# Patient Record
Sex: Male | Born: 1975 | Race: White | Hispanic: No | Marital: Single | Smoking: Never smoker
Health system: Southern US, Community
[De-identification: ages and names within clinical notes are randomized; demographics above are authoritative.]

## PROBLEM LIST (undated history)

## (undated) DIAGNOSIS — S065XAA Traumatic subdural hemorrhage with loss of consciousness status unknown, initial encounter: Secondary | ICD-10-CM

## (undated) DIAGNOSIS — K219 Gastro-esophageal reflux disease without esophagitis: Secondary | ICD-10-CM

## (undated) DIAGNOSIS — S065X9A Traumatic subdural hemorrhage with loss of consciousness of unspecified duration, initial encounter: Secondary | ICD-10-CM

## (undated) DIAGNOSIS — M21372 Foot drop, left foot: Secondary | ICD-10-CM

## (undated) DIAGNOSIS — R413 Other amnesia: Secondary | ICD-10-CM

## (undated) DIAGNOSIS — S069X9A Unspecified intracranial injury with loss of consciousness of unspecified duration, initial encounter: Secondary | ICD-10-CM

## (undated) DIAGNOSIS — M1990 Unspecified osteoarthritis, unspecified site: Secondary | ICD-10-CM

## (undated) DIAGNOSIS — R091 Pleurisy: Secondary | ICD-10-CM

## (undated) HISTORY — PX: OTHER SURGICAL HISTORY: SHX169

## (undated) HISTORY — DX: Unspecified intracranial injury with loss of consciousness of unspecified duration, initial encounter: S06.9X9A

## (undated) HISTORY — PX: BRAIN SURGERY: SHX531

## (undated) HISTORY — PX: GASTROSTOMY W/ FEEDING TUBE: SUR642

---
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
## 2014-06-10 DIAGNOSIS — Z87820 Personal history of traumatic brain injury: Secondary | ICD-10-CM | POA: Insufficient documentation

## 2014-06-10 DIAGNOSIS — M1711 Unilateral primary osteoarthritis, right knee: Secondary | ICD-10-CM | POA: Insufficient documentation

## 2016-08-15 DIAGNOSIS — S069X0S Unspecified intracranial injury without loss of consciousness, sequela: Secondary | ICD-10-CM

## 2016-08-15 DIAGNOSIS — F068 Other specified mental disorders due to known physiological condition: Secondary | ICD-10-CM | POA: Insufficient documentation

## 2016-08-15 DIAGNOSIS — R270 Ataxia, unspecified: Secondary | ICD-10-CM | POA: Insufficient documentation

## 2016-10-24 ENCOUNTER — Other Ambulatory Visit: Payer: Self-pay | Admitting: Orthopedic Surgery

## 2016-10-24 DIAGNOSIS — M25561 Pain in right knee: Secondary | ICD-10-CM

## 2016-10-31 ENCOUNTER — Ambulatory Visit
Admission: RE | Admit: 2016-10-31 | Discharge: 2016-10-31 | Disposition: A | Discharge status: Home / Self Care | Payer: Medicare Other | Source: Ambulatory Visit | Attending: Orthopedic Surgery | Admitting: Orthopedic Surgery

## 2016-10-31 DIAGNOSIS — R600 Localized edema: Secondary | ICD-10-CM | POA: Insufficient documentation

## 2016-10-31 DIAGNOSIS — M25561 Pain in right knee: Secondary | ICD-10-CM

## 2016-11-06 ENCOUNTER — Encounter: Payer: Self-pay | Admitting: Physical Therapy

## 2016-11-06 ENCOUNTER — Ambulatory Visit: Payer: Medicare Other | Attending: Neurology | Admitting: Physical Therapy

## 2016-11-06 DIAGNOSIS — Z9181 History of falling: Secondary | ICD-10-CM | POA: Insufficient documentation

## 2016-11-06 DIAGNOSIS — R42 Dizziness and giddiness: Secondary | ICD-10-CM

## 2016-11-06 DIAGNOSIS — R262 Difficulty in walking, not elsewhere classified: Secondary | ICD-10-CM | POA: Diagnosis present
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
## 2016-11-13 ENCOUNTER — Ambulatory Visit: Payer: Medicare Other | Admitting: Physical Therapy

## 2016-11-15 ENCOUNTER — Ambulatory Visit: Payer: Medicare Other | Attending: Neurology | Admitting: Physical Therapy

## 2016-11-15 ENCOUNTER — Encounter: Payer: Self-pay | Admitting: Physical Therapy

## 2016-11-15 DIAGNOSIS — R42 Dizziness and giddiness: Secondary | ICD-10-CM | POA: Diagnosis present

## 2016-11-15 DIAGNOSIS — Z9181 History of falling: Secondary | ICD-10-CM | POA: Insufficient documentation

## 2016-11-15 DIAGNOSIS — R262 Difficulty in walking, not elsewhere classified: Secondary | ICD-10-CM | POA: Diagnosis present
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
## 2016-11-20 ENCOUNTER — Ambulatory Visit: Payer: Medicare Other | Admitting: Physical Therapy

## 2016-11-23 ENCOUNTER — Ambulatory Visit: Payer: Medicare Other | Admitting: Physical Therapy

## 2016-11-27 ENCOUNTER — Encounter: Payer: Medicare Other | Admitting: Physical Therapy

## 2016-11-30 ENCOUNTER — Ambulatory Visit: Payer: Medicare Other | Admitting: Physical Therapy

## 2016-11-30 ENCOUNTER — Encounter: Payer: Self-pay | Admitting: Physical Therapy

## 2016-11-30 DIAGNOSIS — R42 Dizziness and giddiness: Secondary | ICD-10-CM

## 2016-11-30 DIAGNOSIS — R262 Difficulty in walking, not elsewhere classified: Secondary | ICD-10-CM

## 2016-11-30 DIAGNOSIS — Z9181 History of falling: Secondary | ICD-10-CM
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
## 2016-12-04 ENCOUNTER — Encounter: Payer: Medicare Other | Admitting: Physical Therapy

## 2016-12-07 ENCOUNTER — Ambulatory Visit: Payer: Medicare Other | Admitting: Physical Therapy

## 2016-12-07 ENCOUNTER — Encounter: Payer: Self-pay | Admitting: Physical Therapy

## 2016-12-07 DIAGNOSIS — Z9181 History of falling: Secondary | ICD-10-CM

## 2016-12-07 DIAGNOSIS — R42 Dizziness and giddiness: Secondary | ICD-10-CM | POA: Diagnosis not present

## 2016-12-07 DIAGNOSIS — R262 Difficulty in walking, not elsewhere classified: Secondary | ICD-10-CM
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
## 2016-12-11 ENCOUNTER — Encounter: Payer: Medicare Other | Admitting: Physical Therapy

## 2016-12-12 ENCOUNTER — Ambulatory Visit: Payer: Medicare Other | Admitting: Physical Therapy

## 2016-12-21 ENCOUNTER — Encounter: Payer: Self-pay | Admitting: Physical Therapy

## 2016-12-21 ENCOUNTER — Ambulatory Visit: Payer: Medicare Other | Attending: Neurology | Admitting: Physical Therapy

## 2016-12-21 DIAGNOSIS — R42 Dizziness and giddiness: Secondary | ICD-10-CM | POA: Diagnosis present

## 2016-12-21 DIAGNOSIS — R262 Difficulty in walking, not elsewhere classified: Secondary | ICD-10-CM | POA: Insufficient documentation

## 2016-12-21 DIAGNOSIS — Z9181 History of falling: Secondary | ICD-10-CM
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
## 2016-12-28 ENCOUNTER — Ambulatory Visit: Payer: Medicare Other | Admitting: Physical Therapy

## 2016-12-28 ENCOUNTER — Encounter: Payer: Self-pay | Admitting: Physical Therapy

## 2016-12-28 DIAGNOSIS — Z9181 History of falling: Secondary | ICD-10-CM

## 2016-12-28 DIAGNOSIS — R42 Dizziness and giddiness: Secondary | ICD-10-CM | POA: Diagnosis not present

## 2016-12-28 DIAGNOSIS — R262 Difficulty in walking, not elsewhere classified: Secondary | ICD-10-CM
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
## 2017-01-04 ENCOUNTER — Encounter: Payer: Medicare Other | Admitting: Physical Therapy

## 2017-01-09 ENCOUNTER — Encounter: Payer: Medicare Other | Admitting: Physical Therapy

## 2017-01-25 ENCOUNTER — Ambulatory Visit: Payer: Medicare Other | Admitting: Physical Therapy

## 2017-01-29 ENCOUNTER — Encounter: Payer: Medicare Other | Admitting: Physical Therapy

## 2017-02-08 ENCOUNTER — Encounter: Payer: Medicare Other | Admitting: Physical Therapy

## 2018-01-01 ENCOUNTER — Ambulatory Visit (HOSPITAL_BASED_OUTPATIENT_CLINIC_OR_DEPARTMENT_OTHER): Payer: Medicare Other | Admitting: Student in an Organized Health Care Education/Training Program

## 2018-01-01 ENCOUNTER — Other Ambulatory Visit: Payer: Self-pay

## 2018-01-01 ENCOUNTER — Encounter: Payer: Self-pay | Admitting: Student in an Organized Health Care Education/Training Program

## 2018-01-01 ENCOUNTER — Ambulatory Visit
Admission: RE | Admit: 2018-01-01 | Discharge: 2018-01-01 | Disposition: A | Discharge status: Home / Self Care | Payer: Medicare Other | Source: Ambulatory Visit | Attending: Student in an Organized Health Care Education/Training Program | Admitting: Student in an Organized Health Care Education/Training Program

## 2018-01-01 VITALS — BP 128/88 | HR 72 | Temp 98.3°F | Resp 13 | Ht 73.0 in | Wt 248.5 lb

## 2018-01-01 DIAGNOSIS — M545 Low back pain: Secondary | ICD-10-CM | POA: Insufficient documentation

## 2018-01-01 DIAGNOSIS — M47816 Spondylosis without myelopathy or radiculopathy, lumbar region: Secondary | ICD-10-CM

## 2018-01-01 DIAGNOSIS — S069X0S Unspecified intracranial injury without loss of consciousness, sequela: Secondary | ICD-10-CM

## 2018-01-01 DIAGNOSIS — Z79899 Other long term (current) drug therapy: Secondary | ICD-10-CM | POA: Diagnosis not present

## 2018-01-01 DIAGNOSIS — G8929 Other chronic pain: Secondary | ICD-10-CM

## 2018-01-01 DIAGNOSIS — G894 Chronic pain syndrome: Secondary | ICD-10-CM | POA: Diagnosis not present

## 2018-01-01 DIAGNOSIS — Z87820 Personal history of traumatic brain injury: Secondary | ICD-10-CM | POA: Insufficient documentation

## 2018-01-01 DIAGNOSIS — R270 Ataxia, unspecified: Secondary | ICD-10-CM

## 2018-01-01 DIAGNOSIS — F068 Other specified mental disorders due to known physiological condition: Secondary | ICD-10-CM

## 2018-01-01 DIAGNOSIS — M4696 Unspecified inflammatory spondylopathy, lumbar region: Secondary | ICD-10-CM | POA: Diagnosis not present

## 2018-01-01 DIAGNOSIS — R4189 Other symptoms and signs involving cognitive functions and awareness: Secondary | ICD-10-CM | POA: Insufficient documentation

## 2018-01-01 MED ORDER — LIDOCAINE HCL 2 % IJ SOLN
10.0000 mL | Freq: Once | INTRAMUSCULAR | Status: AC
  Administered 2018-01-01: 400 mg
  Filled 2018-01-01: qty 40

## 2018-01-01 MED ORDER — SODIUM CHLORIDE 0.9% FLUSH
1.0000 mL | Freq: Once | INTRAVENOUS | Status: AC
  Administered 2018-01-01: 1 mL

## 2018-01-01 MED ORDER — FENTANYL CITRATE (PF) 100 MCG/2ML IJ SOLN
25.0000 ug | INTRAMUSCULAR | Status: DC | PRN
  Administered 2018-01-01: 25 ug via INTRAVENOUS

## 2018-01-01 MED ORDER — FENTANYL CITRATE (PF) 100 MCG/2ML IJ SOLN
INTRAMUSCULAR | Status: AC
  Filled 2018-01-01: qty 2

## 2018-01-01 MED ORDER — DEXAMETHASONE SODIUM PHOSPHATE 10 MG/ML IJ SOLN
INTRAMUSCULAR | Status: AC
  Filled 2018-01-01: qty 1

## 2018-01-01 MED ORDER — IOPAMIDOL (ISOVUE-M 200) INJECTION 41%
10.0000 mL | Freq: Once | INTRAMUSCULAR | Status: AC
  Administered 2018-01-01: 10 mL via EPIDURAL
  Filled 2018-01-01: qty 10

## 2018-01-01 MED ORDER — DEXAMETHASONE SODIUM PHOSPHATE 10 MG/ML IJ SOLN
10.0000 mg | Freq: Once | INTRAMUSCULAR | Status: AC
  Administered 2018-01-01: 10 mg
  Filled 2018-01-01: qty 1

## 2018-01-01 MED ORDER — ROPIVACAINE HCL 2 MG/ML IJ SOLN
1.0000 mL | Freq: Once | INTRAMUSCULAR | Status: DC
  Filled 2018-01-01: qty 10

## 2018-01-01 MED ORDER — SODIUM CHLORIDE 0.9 % IJ SOLN
INTRAMUSCULAR | Status: AC
  Filled 2018-01-01: qty 10

## 2018-01-01 NOTE — Patient Instructions (Signed)
____________________________________________________________________________________________  Post-Procedure Discharge Instructions  Instructions:  Apply ice: Fill a plastic sandwich bag with crushed ice. Cover it with a small towel and apply to injection site. Apply for 15 minutes then remove x 15 minutes. Repeat sequence on day of procedure, until you go to bed. The purpose is to minimize swelling and discomfort after procedure.  Apply heat: Apply heat to procedure site starting the day following the procedure. The purpose is to treat any soreness and discomfort from the procedure.  Food intake: Start with clear liquids (like water) and advance to regular food, as tolerated.   Physical activities: Keep activities to a minimum for the first 8 hours after the procedure.   Driving: If you have received any sedation, you are not allowed to drive for 24 hours after your procedure.  Blood thinner: Restart your blood thinner 6 hours after your procedure. (Only for those taking blood thinners)  Insulin: As soon as you can eat, you may resume your normal dosing schedule. (Only for those taking insulin)  Infection prevention: Keep procedure site clean and dry.  Post-procedure Pain Diary: Extremely important that this be done correctly and accurately. Recorded information will be used to determine the next step in treatment.  Pain evaluated is that of treated area only. Do not include pain from an untreated area.  Complete every hour, on the hour, for the initial 8 hours. Set an alarm to help you do this part accurately.  Do not go to sleep and have it completed later. It will not be accurate.  Follow-up appointment: Keep your follow-up appointment after the procedure. Usually 2 weeks for most procedures. (6 weeks in the case of radiofrequency.) Bring you pain diary.   Expect:  From numbing medicine (AKA: Local Anesthetics): Numbness or decrease in pain.  Onset: Full effect within 15  minutes of injected.  Duration: It will depend on the type of local anesthetic used. On the average, 1 to 8 hours.   From steroids: Decrease in swelling or inflammation. Once inflammation is improved, relief of the pain will follow.  Onset of benefits: Depends on the amount of swelling present. The more swelling, the longer it will take for the benefits to be seen. In some cases, up to 10 days.  Duration: Steroids will stay in the system x 2 weeks. Duration of benefits will depend on multiple posibilities including persistent irritating factors.  Occasional side-effects: Facial flushing, cramps (if present, drink Gatorade and take over-the-counter Magnesium 450-500 mg once to twice a day).  From procedure: Some discomfort is to be expected once the numbing medicine wears off. This should be minimal if ice and heat are applied as instructed.  Call if:  You experience numbness and weakness that gets worse with time, as opposed to wearing off.  New onset bowel or bladder incontinence. (This applies to Spinal procedures only)  Emergency Numbers:  [facility] business hours (Monday - Thursday, 8:00 AM - 4:00 PM) (Friday, 9:00 AM - 12:00 Noon): [phone]  After hours: [phone] ____________________________________________________________________________________________   Facet Joint Block The facet joints connect the bones of the spine (vertebrae). They make it possible for you to bend, twist, and make other movements with your spine. They also keep you from bending too far, twisting too far, and making other excessive movements. A facet joint block is a procedure where a numbing medicine (anesthetic) is injected into a facet joint. Often, a type of anti-inflammatory medicine called a steroid is also injected. A facet joint  block may be done to diagnose neck or back pain. If the pain gets better after a facet joint block, it means the pain is probably coming from the facet joint. If the  pain does not get better, it means the pain is probably not coming from the facet joint. A facet joint block may also be done to relieve neck or back pain caused by an inflamed facet joint. A facet joint block is only done to relieve pain if the pain does not improve with other methods, such as medicine, exercise programs, and physical therapy. Tell a health care provider about:  Any allergies you have.  All medicines you are taking, including vitamins, herbs, eye drops, creams, and over-the-counter medicines.  Any problems you or family members have had with anesthetic medicines.  Any blood disorders you have.  Any surgeries you have had.  Any medical conditions you have.  Whether you are pregnant or may be pregnant. What are the risks? Generally, this is a safe procedure. However, problems may occur, including:  Bleeding.  Injury to a nerve near the injection site.  Pain at the injection site.  Weakness or numbness in areas controlled by nerves near the injection site.  Infection.  Temporary fluid retention.  Allergic reactions to medicines or dyes.  Injury to other structures or organs near the injection site.  What happens before the procedure?  Follow instructions from your health care provider about eating or drinking restrictions.  Ask your health care provider about: ? Changing or stopping your regular medicines. This is especially important if you are taking diabetes medicines or blood thinners. ? Taking medicines such as aspirin and ibuprofen. These medicines can thin your blood. Do not take these medicines before your procedure if your health care provider instructs you not to.  Do not take any new dietary supplements or medicines without asking your health care provider first.  Plan to have someone take you home after the procedure. What happens during the procedure?  You may need to remove your clothing and dress in an open-back gown.  The procedure will  be done while you are lying on an X-ray table. You will most likely be asked to lie on your stomach, but you may be asked to lie in a different position if an injection will be made in your neck.  Machines will be used to monitor your oxygen levels, heart rate, and blood pressure.  If an injection will be made in your neck, an IV tube will be inserted into one of your veins. Fluids and medicine will flow directly into your body through the IV tube.  The area over the facet joint where the injection will be made will be cleaned with soap. The surrounding skin will be covered with clean drapes.  A numbing medicine (local anesthetic) will be applied to your skin. Your skin may sting or burn for a moment.  A video X-ray machine (fluoroscopy) will be used to locate the joint. In some cases, a CT scan may be used.  A contrast dye may be injected into the facet joint area to help locate the joint.  When the joint is located, an anesthetic will be injected into the joint through the needle.  Your health care provider will ask you whether you feel pain relief. If you do feel relief, a steroid may be injected to provide pain relief for a longer period of time. If you do not feel relief or feel only partial relief, additional  injections of an anesthetic may be made in other facet joints.  The needle will be removed.  Your skin will be cleaned.  A bandage (dressing) will be applied over each injection site. The procedure may vary among health care providers and hospitals. What happens after the procedure?  You will be observed for 15-30 minutes before being allowed to go home. This information is not intended to replace advice given to you by your health care provider. Make sure you discuss any questions you have with your health care provider. Document Released: 07/18/2006 Document Revised: 03/30/2015 Document Reviewed: 11/22/2014 Elsevier Interactive Patient Education  [employer].

## 2018-01-01 NOTE — Progress Notes (Signed)
Patient's Name: Timothy Farley  MRN: [id number]  Referring Provider: Harvest Dark, FNP  DOB: [birth year]  PCP: Kirk Ruths, MD  DOS: 01/01/2018  Note by: Gillis Santa, MD  Service setting: Ambulatory outpatient  Specialty: Interventional Pain Management  Location: [facility] (AMB) Pain Management Facility    Patient type: New patient ("FAST-TRACK" Evaluation)   Warning: This referral option does not include the extensive pharmacological evaluation required for [place] to take over the patient's medication management. The "Fast-Track" system is designed to bypass the new patient referral waiting list, as well as the normal patient evaluation process, in order to provide a patient in distress with a timely pain management intervention. Because the system was not designed to unfairly get a patient into our pain practice ahead of those already waiting, certain restrictions apply. By requesting a "Fast-Track" consult, the referring physician has opted to continue managing the patient's medications in order to get interventional urgent care.  Primary Reason for Visit: Interventional Pain Management Treatment. CC: Back Pain (lower)   Procedure  HPI  Timothy Farley is a 42 y.o. year old, male patient, who comes today for a  "Fast-Track" new patient evaluation, as requested by Meeler, Sherren Kerns, FNP. The patient has been made aware that this type of referral option is reserved for the Interventional Pain Management portion of our practice and completely excludes the option of medication management. His primarily concern today is the Back Pain (lower)  Pain Assessment: Location: Right, Left, Lower Back Radiating: sometimes to right buttocks Onset: More than a month ago Duration: Chronic pain Quality: Aching, Sharp, Constant Severity: 6 /10 (subjective, self-reported pain score)  Note: Reported level is compatible with observation.                         When using our objective Pain Scale, levels between  6 and 10/10 are said to belong in an emergency room, as it progressively worsens from a 6/10, described as severely limiting, requiring emergency care not usually available at an outpatient pain management facility. At a 6/10 level, communication becomes difficult and requires great effort. Assistance to reach the emergency department may be required. Facial flushing and profuse sweating along with potentially dangerous increases in heart rate and blood pressure will be evident. Effect on ADL: limits daily activities Timing: Constant(severity comes and goes) Modifying factors: nothing helps BP: 128/88  HR: 72  Onset and Duration: Present longer than 3 months Cause of pain: Unknown Severity: Getting worse, NAS-11 at its worse: 10/10, NAS-11 at its best: 2/10, NAS-11 now: 3/10 and NAS-11 on the average: 8/10 Timing: Not influenced by the time of the day Aggravating Factors: Bending, Climbing, Motion, Prolonged standing, Walking, Walking uphill and Walking downhill Alleviating Factors: Sleeping Associated Problems: Dizziness Quality of Pain: Aching, Annoying, Nagging, Sharp and Throbbing Previous Examinations or Tests: X-rays Previous Treatments: The patient denies NONE NOTED  The patient comes into the clinics today, referred to [place] for bilateral L5-S1 transforaminal ESI through Dr. Sharlet Salina with [facility].  Of note patient does have a history of traumatic brain injury and temporal lobectomy 1994 and has been having acute on chronic low back pain with occasional radiation to right buttock. Patient's lumbar spine x-ray from 12/29/2017 shows mild degenerative disc disease at L3-S1 most significantly at L5 and S1 and facet degenerative changes at L5-S1.  Meds   Current Outpatient Medications:  .  Omega-3 Fatty Acids (FISH OIL) 1000 MG CAPS, Take by mouth., Disp: ,  Rfl:  .  pantoprazole (PROTONIX) 40 MG tablet, Take 40 mg by mouth daily., Disp: , Rfl:  .  meclizine (ANTIVERT) 12.5 MG tablet,  Take 12.5 mg by mouth 3 (three) times daily as needed for dizziness., Disp: , Rfl:  .  VALSARTAN PO, Take by mouth daily., Disp: , Rfl:   Current Facility-Administered Medications:  .  ropivacaine (PF) 2 mg/mL (0.2%) (NAROPIN) injection 1 mL, 1 mL, Epidural, Once, Lizzet Hendley, MD  Imaging Review  Knee-R MR wo contrast:  Results for orders placed during the hospital encounter of 10/31/16  MR KNEE RIGHT WO CONTRAST   Narrative CLINICAL DATA:  Right knee pain, weakness and buckling for 1 year. No known injury.  EXAM: MRI OF THE RIGHT KNEE WITHOUT CONTRAST  TECHNIQUE: Multiplanar, multisequence MR imaging of the knee was performed. No intravenous contrast was administered.  COMPARISON:  None.  FINDINGS: MENISCI  Medial meniscus:  Intact.  Lateral meniscus:  Intact.  LIGAMENTS  Cruciates:  Intact.  Collaterals:  Intact.  CARTILAGE  Patellofemoral: Thinning and mild underlying subchondral edema are seen in the inferior aspect of the medial femoral trochlea.  Medial:  Preserved.  Lateral:  Preserved.  Joint:  No effusion.  Popliteal Fossa:  No Baker's cyst.  Extensor Mechanism: Increased T2 signal in the quadriceps tendon at its attachment to the patella could be due to tendinosis or strain. No tear. The extensor mechanism is otherwise normal.  Bones: No fracture or worrisome lesion. Mild subchondral edema is seen in the femoral trochlea subjacent to cartilage loss described above.  Other: None.  IMPRESSION: Edema in the distal quadriceps tendon compatible with tendinosis or strain. No tear.  Cartilage thinning in the inferior aspect of the medial femoral trochlea with underlying mild subchondral edema.  Negative for meniscal or ligament tear.   Electronically Signed   By: Inge Rise M.D.   On: 10/31/2016 15:53      Complexity Note: Imaging results reviewed. Results shared with Timothy Farley, using Layman's terms.                          ROS  Cardiovascular: No reported cardiovascular signs or symptoms such as High blood pressure, coronary artery disease, abnormal heart rate or rhythm, heart attack, blood thinner therapy or heart weakness and/or failure Pulmonary or Respiratory: No reported pulmonary signs or symptoms such as wheezing and difficulty taking a deep full breath (Asthma), difficulty blowing air out (Emphysema), coughing up mucus (Bronchitis), persistent dry cough, or temporary stoppage of breathing during sleep Neurological: Curved spine Review of Past Neurological Studies: No results found for this or any previous visit. Psychological-Psychiatric: No reported psychological or psychiatric signs or symptoms such as difficulty sleeping, anxiety, depression, delusions or hallucinations (schizophrenial), mood swings (bipolar disorders) or suicidal ideations or attempts Gastrointestinal: Reflux or heatburn Genitourinary: No reported renal or genitourinary signs or symptoms such as difficulty voiding or producing urine, peeing blood, non-functioning kidney, kidney stones, difficulty emptying the bladder, difficulty controlling the flow of urine, or chronic kidney disease Hematological: No reported hematological signs or symptoms such as prolonged bleeding, low or poor functioning platelets, bruising or bleeding easily, hereditary bleeding problems, low energy levels due to low hemoglobin or being anemic Endocrine: No reported endocrine signs or symptoms such as high or low blood sugar, rapid heart rate due to high thyroid levels, obesity or weight gain due to slow thyroid or thyroid disease Rheumatologic: Joint aches and or swelling due to excess  weight (Osteoarthritis) Musculoskeletal: Negative for myasthenia gravis, muscular dystrophy, multiple sclerosis or malignant hyperthermia Work History: Disabled  Allergies  Mr. Haskew is allergic to penicillins.  Laboratory Chemistry  Inflammation Markers (CRP: Acute Phase)  (ESR: Chronic Phase) No results found for: CRP, ESRSEDRATE, LATICACIDVEN                       Rheumatology Markers No results found for: RF, ANA, LABURIC, URICUR, LYMEIGGIGMAB, LYMEABIGMQN, HLAB27                      Renal Function Markers No results found for: BUN, CREATININE, BCR, GFRAA, GFRNONAA                           Hepatic Function Markers No results found for: AST, ALT, ALBUMIN, ALKPHOS, HCVAB, AMYLASE, LIPASE, AMMONIA                      Electrolytes No results found for: NA, K, CL, CALCIUM, MG, PHOS                      Neuropathy Markers No results found for: VITAMINB12, FOLATE, HGBA1C, HIV                      CNS Tests No results found for: COLORCSF, APPEARCSF, RBCCOUNTCSF, WBCCSF, POLYSCSF, LYMPHSCSF, EOSCSF, PROTEINCSF, GLUCCSF, JCVIRUS, CSFOLI, IGGCSF                      Bone Pathology Markers No results found for: VD25OH, [id number], [id number], [id number], 25OHVITD1, 25OHVITD2, 25OHVITD3, TESTOFREE, TESTOSTERONE                       Coagulation Parameters No results found for: INR, LABPROT, APTT, PLT, DDIMER                      Cardiovascular Markers No results found for: BNP, CKTOTAL, CKMB, TROPONINI, HGB, HCT                       CA Markers No results found for: CEA, CA125, LABCA2                      Note: Lab results reviewed.  [place]  Drug: Mr. Maue  has no drug history on file. Alcohol:  has no alcohol history on file. Tobacco:  reports that he has never smoked. He has never used smokeless tobacco. Medical:  has a past medical history of TBI (traumatic brain injury) ([place]) (1994). Family: family history is not on file.  No past surgical history on file. Active Ambulatory Problems    Diagnosis Date Noted  . 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 History of traumatic brain injury 06/10/2014  . Osteoarthritis of right knee 06/10/2014   Resolved Ambulatory Problems    Diagnosis  Date Noted  . No Resolved Ambulatory Problems   Past Medical History:  Diagnosis Date  . TBI (traumatic brain injury) ([place]) 1994   Constitutional Exam  General appearance: Well nourished, well developed, and well hydrated. In no apparent acute distress Vitals:   01/01/18 1230 01/01/18 1235 01/01/18 1244 01/01/18 1254  BP: 139/72 (!) 144/63 131/89 128/88  Pulse: 70 72    Resp: 15  $'16 14 13  'U$ Temp:      TempSrc:      SpO2: 99% 99% 100% 99%  Weight:      Height:       BMI Assessment: Estimated body mass index is 32.79 kg/m as calculated from the following:   Height as of this encounter: '6\' 1"'$  (1.854 m).   Weight as of this encounter: 248 lb 8 oz (112.7 kg).  BMI interpretation table: BMI level Category Range association with higher incidence of chronic pain  <18 kg/m2 Underweight   18.5-24.9 kg/m2 Ideal body weight   25-29.9 kg/m2 Overweight Increased incidence by 20%  30-34.9 kg/m2 Obese (Class I) Increased incidence by 68%  35-39.9 kg/m2 Severe obesity (Class II) Increased incidence by 136%  >40 kg/m2 Extreme obesity (Class III) Increased incidence by 254%   Patient's current BMI Ideal Body weight  Body mass index is 32.79 kg/m. Ideal body weight: 79.9 kg (176 lb 2.4 oz) Adjusted ideal body weight: 93 kg (205 lb 1.4 oz)   BMI Readings from Last 4 Encounters:  01/01/18 32.79 kg/m   Wt Readings from Last 4 Encounters:  01/01/18 248 lb 8 oz (112.7 kg)  Psych/Mental status: Alert, oriented x 3 (person, place, & time)       Eyes: PERLA Respiratory: No evidence of acute respiratory distress  Lumbar Spine Area Exam  Skin & Axial Inspection: No masses, redness, or swelling Alignment: Symmetrical Functional ROM: Decreased ROM       Stability: No instability detected Muscle Tone/Strength: Functionally intact. No obvious neuro-muscular anomalies detected. Sensory (Neurological): Musculoskeletal pain pattern Palpation: No palpable anomalies       Provocative  Tests: Hyperextension/rotation test: (+) bilaterally for facet joint pain. Lumbar quadrant test (Kemp's test): (+) bilaterally for facet joint pain. Lateral bending test: (+) due to pain. Patrick's Maneuver: deferred today                   FABER test: deferred today                   S-I anterior distraction/compression test: deferred today         S-I lateral compression test: deferred today         S-I Thigh-thrust test: deferred today         S-I Gaenslen's test: deferred today          Gait & Posture Assessment  Ambulation: Unassisted Gait: Relatively normal for age and body habitus Posture: WNL   Lower Extremity Exam    Side: Right lower extremity  Side: Left lower extremity  Stability: No instability observed          Stability: No instability observed          Skin & Extremity Inspection: Skin color, temperature, and hair growth are WNL. No peripheral edema or cyanosis. No masses, redness, swelling, asymmetry, or associated skin lesions. No contractures.  Skin & Extremity Inspection: Skin color, temperature, and hair growth are WNL. No peripheral edema or cyanosis. No masses, redness, swelling, asymmetry, or associated skin lesions. No contractures.  Functional ROM: Unrestricted ROM                  Functional ROM: Unrestricted ROM                  Muscle Tone/Strength: Functionally intact. No obvious neuro-muscular anomalies detected.  Muscle Tone/Strength: Functionally intact. No obvious neuro-muscular anomalies detected.  Sensory (Neurological): Unimpaired  Sensory (Neurological): Unimpaired  Palpation: No palpable anomalies  Palpation: No palpable anomalies    Procedure  42 year old male who was referred from Dr. Sharlet Salina as a fast track for bilateral L5-S1 transforaminal epidural steroid injections for axial low back pain with radiation into right greater than left buttock.  I had an extensive discussion with the patient about his symptoms.  The patient's symptoms do not  sound dermatomal or radicular in nature.  Patient denies pain that radiates down his lateral leg or posterior leg to suggest L5 or S1 nerve root pathology.  Furthermore patient only has an x-ray which shows L5-S1 facet degeneration.  I had an extensive discussion with the patient about the best course of action and I recommend that we trial a lumbar facet medial branch nerve block at L3, L4, L5 to target his lumbar facet arthropathy rather than doing a transforaminal epidural steroid injection since the patient's symptoms are primarily axial low back pain worsened with lumbar extension and lateral rotation as well as x-ray evidence to suggest mild to moderate facet pathology most pronounced at L5-S1.

## 2018-01-01 NOTE — Progress Notes (Deleted)
Patient's Name: Timothy Farley  MRN: [id number]  Referring Provider: Harvest Dark, FNP  DOB: [birth year]  PCP: Kirk Ruths, MD  DOS: 01/01/2018  Note by: Gillis Santa, MD  Service setting: Ambulatory outpatient  Specialty: Interventional Pain Management  Patient type: Established  Location: [facility] (AMB) Pain Management Facility  Visit type: Interventional Procedure   Primary Reason for Visit: Interventional Pain Management Treatment. CC: Back Pain (lower)  Procedure:          Anesthesia, Analgesia, Anxiolysis:  Type: Trans-Foraminal Epidural Steroid Injection          Purpose: Diagnostic/Therapeutic Region: Posterolateral Lumbosacral Target Area: The 6 o'clock position under the pedicle, on the affected side. Approach: Posterior Percutaneous Paravertebral approach. Level: L5-S1 Level Laterality: Bilateral       Type: Moderate (Conscious) Sedation combined with Local Anesthesia Indication(s): Analgesia and Anxiety Route: Intravenous (IV) IV Access: Secured Sedation: Meaningful verbal contact was maintained at all times during the procedure  Local Anesthetic: Lidocaine 1-2%  Position: Prone   Indications: 1. Chronic bilateral low back pain with bilateral sciatica   2. Lumbar facet arthropathy (L5/S1)   3. Facet arthritis, degenerative, lumbar spine   4. Chronic pain syndrome   5. Cognitive deficit as late effect of traumatic brain injury ([place])   6. Ataxia   7. History of traumatic brain injury    Pain Score: Pre-procedure: 6 /10 Post-procedure: 6 /10  Pre-op Assessment:  Timothy Farley is a 42 y.o. (year old), male patient, seen today for interventional treatment. He  has no past surgical history on file. Mr. Francis has a current medication list which includes the following prescription(s): meclizine, pantoprazole, and valsartan, and the following Facility-Administered Medications: dexamethasone, iopamidol, lidocaine, ropivacaine (pf) 2 mg/ml (0.2%), and sodium chloride  flush. His primarily concern today is the Back Pain (lower)  Initial Vital Signs:  Pulse/HCG Rate: 90  Temp: 98.3 F (36.8 C) Resp: 16 BP: (!) 142/96 SpO2: 100 %  BMI: Estimated body mass index is 32.79 kg/m as calculated from the following:   Height as of this encounter: 6\' 1"  (1.854 m).   Weight as of this encounter: 248 lb 8 oz (112.7 kg).  Risk Assessment: Allergies: Reviewed. He is allergic to penicillins.  Allergy Precautions: None required Coagulopathies: Reviewed. None identified.  Blood-thinner therapy: None at this time Active Infection(s): Reviewed. None identified. Timothy Farley is afebrile  Site Confirmation: Timothy Farley was asked to confirm the procedure and laterality before marking the site Procedure checklist: Completed Consent: Before the procedure and under the influence of no sedative(s), amnesic(s), or anxiolytics, the patient was informed of the treatment options, risks and possible complications. To fulfill our ethical and legal obligations, as recommended by the American Medical Association's Code of Ethics, I have informed the patient of my clinical impression; the nature and purpose of the treatment or procedure; the risks, benefits, and possible complications of the intervention; the alternatives, including doing nothing; the risk(s) and benefit(s) of the alternative treatment(s) or procedure(s); and the risk(s) and benefit(s) of doing nothing. The patient was provided information about the general risks and possible complications associated with the procedure. These may include, but are not limited to: failure to achieve desired goals, infection, bleeding, organ or nerve damage, allergic reactions, paralysis, and death. In addition, the patient was informed of those risks and complications associated to Spine-related procedures, such as failure to decrease pain; infection (i.e.: Meningitis, epidural or intraspinal abscess); bleeding (i.e.: epidural hematoma,  subarachnoid hemorrhage, or any other type  of intraspinal or peri-dural bleeding); organ or nerve damage (i.e.: Any type of peripheral nerve, nerve root, or spinal cord injury) with subsequent damage to sensory, motor, and/or autonomic systems, resulting in permanent pain, numbness, and/or weakness of one or several areas of the body; allergic reactions; (i.e.: anaphylactic reaction); and/or death. Furthermore, the patient was informed of those risks and complications associated with the medications. These include, but are not limited to: allergic reactions (i.e.: anaphylactic or anaphylactoid reaction(s)); adrenal axis suppression; blood sugar elevation that in diabetics may result in ketoacidosis or comma; water retention that in patients with history of congestive heart failure may result in shortness of breath, pulmonary edema, and decompensation with resultant heart failure; weight gain; swelling or edema; medication-induced neural toxicity; particulate matter embolism and blood vessel occlusion with resultant organ, and/or nervous system infarction; and/or aseptic necrosis of one or more joints. Finally, the patient was informed that Medicine is not an exact science; therefore, there is also the possibility of unforeseen or unpredictable risks and/or possible complications that may result in a catastrophic outcome. The patient indicated having understood very clearly. We have given the patient no guarantees and we have made no promises. Enough time was given to the patient to ask questions, all of which were answered to the patient's satisfaction. Timothy Farley has indicated that he wanted to continue with the procedure. Attestation: I, the ordering provider, attest that I have discussed with the patient the benefits, risks, side-effects, alternatives, likelihood of achieving goals, and potential problems during recovery for the procedure that I have provided informed consent. Date  Time: 01/01/2018 10:47  AM  Pre-Procedure Preparation:  Monitoring: As per clinic protocol. Respiration, ETCO2, SpO2, BP, heart rate and rhythm monitor placed and checked for adequate function Safety Precautions: Patient was assessed for positional comfort and pressure points before starting the procedure. Time-out: I initiated and conducted the "Time-out" before starting the procedure, as per protocol. The patient was asked to participate by confirming the accuracy of the "Time Out" information. Verification of the correct person, site, and procedure were performed and confirmed by me, the nursing staff, and the patient. "Time-out" conducted as per Joint Commission's Universal Protocol (UP.01.01.01). Time:    Description of Procedure:          Area Prepped: Entire Posterior Lumbosacral Area Prepping solution: ChloraPrep (2% chlorhexidine gluconate and 70% isopropyl alcohol) Safety Precautions: Aspiration looking for blood return was conducted prior to all injections. At no point did we inject any substances, as a needle was being advanced. No attempts were made at seeking any paresthesias. Safe injection practices and needle disposal techniques used. Medications properly checked for expiration dates. SDV (single dose vial) medications used. Description of the Procedure: Protocol guidelines were followed. The patient was placed in position over the procedure table. The target area was identified and the area prepped in the usual manner. Skin & deeper tissues infiltrated with local anesthetic. Appropriate amount of time allowed to pass for local anesthetics to take effect. The procedure needles were then advanced to the target area. Proper needle placement secured. Negative aspiration confirmed. Solution injected in intermittent fashion, asking for systemic symptoms every 0.5cc of injectate. The needles were then removed and the area cleansed, making sure to leave some of the prepping solution back to take advantage of its long  term bactericidal properties.  Vitals:   01/01/18 1104  BP: (!) 142/96  Pulse: 90  Resp: 16  Temp: 98.3 F (36.8 C)  TempSrc: Oral  SpO2: 100%  Weight: 248 lb 8 oz (112.7 kg)  Height: 6\' 1"  (1.854 m)    Start Time:   hrs. End Time:   hrs.  Materials:  Needle(s) Type: Spinal Needle Gauge: 22G Length: 3.5-in Medication(s): Please see orders for medications and dosing details. 3 cc solution made of 2 cc of 0.2% ropivacaine, 1 cc of Decadron 10 mg/cc.  1.5 cc injected at each level bilaterally. Imaging Guidance (Spinal):          Type of Imaging Technique: Fluoroscopy Guidance (Spinal) Indication(s): Assistance in needle guidance and placement for procedures requiring needle placement in or near specific anatomical locations not easily accessible without such assistance. Exposure Time: Please see nurses notes. Contrast: Before injecting any contrast, we confirmed that the patient did not have an allergy to iodine, shellfish, or radiological contrast. Once satisfactory needle placement was completed at the desired level, radiological contrast was injected. Contrast injected under live fluoroscopy. No contrast complications. See chart for type and volume of contrast used. Fluoroscopic Guidance: I was personally present during the use of fluoroscopy. "Tunnel Vision Technique" used to obtain the best possible view of the target area. Parallax error corrected before commencing the procedure. "Direction-depth-direction" technique used to introduce the needle under continuous pulsed fluoroscopy. Once target was reached, antero-posterior, oblique, and lateral fluoroscopic projection used confirm needle placement in all planes. Images permanently stored in EMR. Interpretation: I personally interpreted the imaging intraoperatively. Adequate needle placement confirmed in multiple planes. Appropriate spread of contrast into desired area was observed. No evidence of afferent or efferent intravascular  uptake. No intrathecal or subarachnoid spread observed. Permanent images saved into the patient's record.  Antibiotic Prophylaxis:   Anti-infectives (From admission, onward)   None     Indication(s): None identified  Post-operative Assessment:  Post-procedure Vital Signs:  Pulse/HCG Rate: 90  Temp: 98.3 F (36.8 C) Resp: 16 BP: (!) 142/96 SpO2: 100 %  EBL: None  Complications: No immediate post-treatment complications observed by team, or reported by patient.  Note: The patient tolerated the entire procedure well. A repeat set of vitals were taken after the procedure and the patient was kept under observation following institutional policy, for this type of procedure. Post-procedural neurological assessment was performed, showing return to baseline, prior to discharge. The patient was provided with post-procedure discharge instructions, including a section on how to identify potential problems. Should any problems arise concerning this procedure, the patient was given instructions to immediately contact us, at any time, without hesitation. In any case, we plan to contact the patient by telephone for a follow-up status report regarding this interventional procedure.  Comments:  No additional relevant information.  Plan of Care    Imaging Orders     DG C-Arm 1-60 Min-No Report Procedure Orders    No procedure(s) ordered today    Medications ordered for procedure: Meds ordered this encounter  Medications  . lidocaine (XYLOCAINE) 2 % (with pres) injection 200 mg  . iopamidol (ISOVUE-M) 41 % intrathecal injection 10 mL  . dexamethasone (DECADRON) injection 10 mg  . sodium chloride flush (NS) 0.9 % injection 1 mL  . ropivacaine (PF) 2 mg/mL (0.2%) (NAROPIN) injection 1 mL   Medications administered: Sharyl Nimrod had no medications administered during this visit.  See the medical record for exact dosing, route, and time of administration.  Disposition: Discharge home   Discharge Date & Time: 01/01/2018;   hrs.   Physician-requested Follow-up: No follow-ups on file.  Future Appointments  Date Time Provider [place]  01/01/2018 11:10 AM [id number]  01/01/2018 11:15 AM Gillis Santa, MD [facility] None   Primary Care Physician: Kirk Ruths, MD Location: [facility] Note by: Gillis Santa, MD Date: 01/01/2018; Time: 11:08 AM  Disclaimer:  Medicine is not an exact science. The only guarantee in medicine is that nothing is guaranteed. It is important to note that the decision to proceed with this intervention was based on the information collected from the patient. The Data and conclusions were drawn from the patient's questionnaire, the interview, and the physical examination. Because the information was provided in large part by the patient, it cannot be guaranteed that it has not been purposely or unconsciously manipulated. Every effort has been made to obtain as much relevant data as possible for this evaluation. It is important to note that the conclusions that lead to this procedure are derived in large part from the available data. Always take into account that the treatment will also be dependent on availability of resources and existing treatment guidelines, considered by other Pain Management Practitioners as being common knowledge and practice, at the time of the intervention. For Medico-Legal purposes, it is also important to point out that variation in procedural techniques and pharmacological choices are the acceptable norm. The indications, contraindications, technique, and results of the above procedure should only be interpreted and judged by a Board-Certified Interventional Pain Specialist with extensive familiarity and expertise in the same exact procedure and technique.

## 2018-01-01 NOTE — Progress Notes (Signed)
Safety precautions to be maintained throughout the outpatient stay will include: orient to surroundings, keep bed in low position, maintain call bell within reach at all times, provide assistance with transfer out of bed and ambulation.  

## 2018-01-01 NOTE — Addendum Note (Signed)
Addended by: Gillis Santa on: 01/01/2018 03:17 PM   Modules accepted: Orders

## 2018-01-01 NOTE — Progress Notes (Signed)
Patient's Name: Timothy Farley  MRN: [id number]  Referring Provider: Harvest Dark, FNP  DOB: 1975-05-20  PCP: Kirk Ruths, MD  DOS: 01/01/2018  Note by: Gillis Santa, MD  Service setting: Ambulatory outpatient  Specialty: Interventional Pain Management  Patient type: Established  Location: [facility] (AMB) Pain Management Facility  Visit type: Interventional Procedure   Primary Reason for Visit: Interventional Pain Management Treatment. CC: Back Pain (lower)  Procedure:          Anesthesia, Analgesia, Anxiolysis:  Type: Lumbar Facet, Medial Branch Block(s)          Primary Purpose: Diagnostic Region: Posterolateral Lumbosacral Spine Level: L3, L4, L5, Medial Branch Level(s). Injecting these levels blocks the L3-4, L4-5, lumbar facet joints. Laterality: Bilateral  Type: Moderate (Conscious) Sedation combined with Local Anesthesia Indication(s): Analgesia and Anxiety Route: Intravenous (IV) IV Access: Secured Sedation: Meaningful verbal contact was maintained at all times during the procedure  Local Anesthetic: Lidocaine 1-2%  Position: Prone   Indications: 1. Lumbar facet arthropathy (L5/S1)   2. Chronic midline low back pain without sciatica   3. Facet arthritis, degenerative, lumbar spine   4. Chronic pain syndrome   5. Cognitive deficit as late effect of traumatic brain injury ([place])   6. Ataxia   7. History of traumatic brain injury    Pain Score: Pre-procedure: 6 /10 Post-procedure: 0-No pain/10  Pre-op Assessment:  Timothy Farley is a 42 y.o. (year old), male patient, seen today for interventional treatment. He  has no past surgical history on file. [name] has a current medication list which includes the following prescription(s): fish oil, pantoprazole, meclizine, and valsartan, and the following Facility-Administered Medications: ropivacaine (pf) 2 mg/ml (0.2%). His primarily concern today is the Back Pain (lower)  Initial Vital Signs:  Pulse/HCG Rate: 90ECG  Heart Rate: 68 Temp: 98.3 F (36.8 C) Resp: 16 BP: (!) 142/96 SpO2: 100 %  BMI: Estimated body mass index is 32.79 kg/m as calculated from the following:   Height as of this encounter: 6\' 1"  (1.854 m).   Weight as of this encounter: 248 lb 8 oz (112.7 kg).  Risk Assessment: Allergies: Reviewed. He is allergic to penicillins.  Allergy Precautions: None required Coagulopathies: Reviewed. None identified.  Blood-thinner therapy: None at this time Active Infection(s): Reviewed. None identified. Timothy Farley is afebrile  Site Confirmation: Timothy Farley was asked to confirm the procedure and laterality before marking the site Procedure checklist: Completed Consent: Before the procedure and under the influence of no sedative(s), amnesic(s), or anxiolytics, the patient was informed of the treatment options, risks and possible complications. To fulfill our ethical and legal obligations, as recommended by the American Medical Association's Code of Ethics, I have informed the patient of my clinical impression; the nature and purpose of the treatment or procedure; the risks, benefits, and possible complications of the intervention; the alternatives, including doing nothing; the risk(s) and benefit(s) of the alternative treatment(s) or procedure(s); and the risk(s) and benefit(s) of doing nothing. The patient was provided information about the general risks and possible complications associated with the procedure. These may include, but are not limited to: failure to achieve desired goals, infection, bleeding, organ or nerve damage, allergic reactions, paralysis, and death. In addition, the patient was informed of those risks and complications associated to Spine-related procedures, such as failure to decrease pain; infection (i.e.: Meningitis, epidural or intraspinal abscess); bleeding (i.e.: epidural hematoma, subarachnoid hemorrhage, or any other type of intraspinal or peri-dural bleeding); organ or nerve  damage (i.e.:  Any type of peripheral nerve, nerve root, or spinal cord injury) with subsequent damage to sensory, motor, and/or autonomic systems, resulting in permanent pain, numbness, and/or weakness of one or several areas of the body; allergic reactions; (i.e.: anaphylactic reaction); and/or death. Furthermore, the patient was informed of those risks and complications associated with the medications. These include, but are not limited to: allergic reactions (i.e.: anaphylactic or anaphylactoid reaction(s)); adrenal axis suppression; blood sugar elevation that in diabetics may result in ketoacidosis or comma; water retention that in patients with history of congestive heart failure may result in shortness of breath, pulmonary edema, and decompensation with resultant heart failure; weight gain; swelling or edema; medication-induced neural toxicity; particulate matter embolism and blood vessel occlusion with resultant organ, and/or nervous system infarction; and/or aseptic necrosis of one or more joints. Finally, the patient was informed that Medicine is not an exact science; therefore, there is also the possibility of unforeseen or unpredictable risks and/or possible complications that may result in a catastrophic outcome. The patient indicated having understood very clearly. We have given the patient no guarantees and we have made no promises. Enough time was given to the patient to ask questions, all of which were answered to the patient's satisfaction. Timothy Farley has indicated that he wanted to continue with the procedure. Attestation: I, the ordering provider, attest that I have discussed with the patient the benefits, risks, side-effects, alternatives, likelihood of achieving goals, and potential problems during recovery for the procedure that I have provided informed consent. Date  Time: 01/01/2018 10:47 AM  Pre-Procedure Preparation:  Monitoring: As per clinic protocol. Respiration, ETCO2, SpO2,  BP, heart rate and rhythm monitor placed and checked for adequate function Safety Precautions: Patient was assessed for positional comfort and pressure points before starting the procedure. Time-out: I initiated and conducted the "Time-out" before starting the procedure, as per protocol. The patient was asked to participate by confirming the accuracy of the "Time Out" information. Verification of the correct person, site, and procedure were performed and confirmed by me, the nursing staff, and the patient. "Time-out" conducted as per Joint Commission's Universal Protocol (UP.01.01.01). Time: 1217  Description of Procedure:          Laterality: Bilateral. The procedure was performed in identical fashion on both sides. Levels:   L3, L4, L5,  Medial Branch Level(s) Area Prepped: Posterior Lumbosacral Region Prepping solution: ChloraPrep (2% chlorhexidine gluconate and 70% isopropyl alcohol) Safety Precautions: Aspiration looking for blood return was conducted prior to all injections. At no point did we inject any substances, as a needle was being advanced. Before injecting, the patient was told to immediately notify me if he was experiencing any new onset of "ringing in the ears, or metallic taste in the mouth". No attempts were made at seeking any paresthesias. Safe injection practices and needle disposal techniques used. Medications properly checked for expiration dates. SDV (single dose vial) medications used. After the completion of the procedure, all disposable equipment used was discarded in the proper designated medical waste containers. Local Anesthesia: Protocol guidelines were followed. The patient was positioned over the fluoroscopy table. The area was prepped in the usual manner. The time-out was completed. The target area was identified using fluoroscopy. A 12-in long, straight, sterile hemostat was used with fluoroscopic guidance to locate the targets for each level blocked. Once located, the  skin was marked with an approved surgical skin marker. Once all sites were marked, the skin (epidermis, dermis, and hypodermis), as well as deeper tissues (fat,  connective tissue and muscle) were infiltrated with a small amount of a short-acting local anesthetic, loaded on a 10cc syringe with a 25G, 1.5-in  Needle. An appropriate amount of time was allowed for local anesthetics to take effect before proceeding to the next step. Local Anesthetic: Lidocaine 2.0% The unused portion of the local anesthetic was discarded in the proper designated containers. Technical explanation of process:   L3 Medial Branch Nerve Block (MBB): The target area for the L3 medial branch is at the junction of the postero-lateral aspect of the superior articular process and the superior, posterior, and medial edge of the transverse process of L4. Under fluoroscopic guidance, a Quincke needle was inserted until contact was made with os over the superior postero-lateral aspect of the pedicular shadow (target area). After negative aspiration for blood, 1 mL of the nerve block solution was injected without difficulty or complication. The needle was removed intact. L4 Medial Branch Nerve Block (MBB): The target area for the L4 medial branch is at the junction of the postero-lateral aspect of the superior articular process and the superior, posterior, and medial edge of the transverse process of L5. Under fluoroscopic guidance, a Quincke needle was inserted until contact was made with os over the superior postero-lateral aspect of the pedicular shadow (target area). After negative aspiration for blood, 54mL of the nerve block solution was injected without difficulty or complication. The needle was removed intact. L5 Medial Branch Nerve Block (MBB): The target area for the L5 medial branch is at the junction of the postero-lateral aspect of the superior articular process and the superior, posterior, and medial edge of the sacral ala. Under  fluoroscopic guidance, a Quincke needle was inserted until contact was made with os over the superior postero-lateral aspect of the pedicular shadow (target area). After negative aspiration for blood, 44mL of the nerve block solution was injected without difficulty or complication. The needle was removed intact.  Procedural Needles: 22-gauge, 3.5-inch, Quincke needles used for all levels. Nerve block solution: 8 CC solution made of 7 cc of 0.2% ropivacaine, 1 cc of Decadron 10 mg/cc.  1 cc injected at each level above bilaterally. The unused portion of the solution was discarded in the proper designated containers.  Once the entire procedure was completed, the treated area was cleaned, making sure to leave some of the prepping solution back to take advantage of its long term bactericidal properties.   Illustration of the posterior view of the lumbar spine and the posterior neural structures. Laminae of L2 through S1 are labeled. DPRL5, dorsal primary ramus of L5; DPRS1, dorsal primary ramus of S1; DPR3, dorsal primary ramus of L3; FJ, facet (zygapophyseal) joint L3-L4; I, inferior articular process of L4; LB1, lateral branch of dorsal primary ramus of L1; IAB, inferior articular branches from L3 medial branch (supplies L4-L5 facet joint); IBP, intermediate branch plexus; MB3, medial branch of dorsal primary ramus of L3; NR3, third lumbar nerve root; S, superior articular process of L5; SAB, superior articular branches from L4 (supplies L4-5 facet joint also); TP3, transverse process of L3.  Vitals:   01/01/18 1230 01/01/18 1235 01/01/18 1244 01/01/18 1254  BP: 139/72 (!) 144/63 131/89 128/88  Pulse: 70 72    Resp: 15 16 14 13   Temp:      TempSrc:      SpO2: 99% 99% 100% 99%  Weight:      Height:         Start Time: 1217 hrs. End Time: 1234 hrs.  Imaging Guidance (Spinal):          Type of Imaging Technique: Fluoroscopy Guidance (Spinal) Indication(s): Assistance in needle guidance and  placement for procedures requiring needle placement in or near specific anatomical locations not easily accessible without such assistance. Exposure Time: Please see nurses notes. Contrast: None used. Fluoroscopic Guidance: I was personally present during the use of fluoroscopy. "Tunnel Vision Technique" used to obtain the best possible view of the target area. Parallax error corrected before commencing the procedure. "Direction-depth-direction" technique used to introduce the needle under continuous pulsed fluoroscopy. Once target was reached, antero-posterior, oblique, and lateral fluoroscopic projection used confirm needle placement in all planes. Images permanently stored in EMR. Interpretation: No contrast injected. I personally interpreted the imaging intraoperatively. Adequate needle placement confirmed in multiple planes. Permanent images saved into the patient's record.  Antibiotic Prophylaxis:   Anti-infectives (From admission, onward)   None     Indication(s): None identified  Post-operative Assessment:  Post-procedure Vital Signs:  Pulse/HCG Rate: 7268 Temp: 98.3 F (36.8 C) Resp: 13 BP: 128/88 SpO2: 99 %  EBL: None  Complications: No immediate post-treatment complications observed by team, or reported by patient.  Note: The patient tolerated the entire procedure well. A repeat set of vitals were taken after the procedure and the patient was kept under observation following institutional policy, for this type of procedure. Post-procedural neurological assessment was performed, showing return to baseline, prior to discharge. The patient was provided with post-procedure discharge instructions, including a section on how to identify potential problems. Should any problems arise concerning this procedure, the patient was given instructions to immediately contact us, at any time, without hesitation. In any case, we plan to contact the patient by telephone for a follow-up status  report regarding this interventional procedure.  Comments:  No additional relevant information. 5 out of 5 strength bilateral lower extremity: Plantar flexion, dorsiflexion, knee flexion, knee extension.  Plan of Care   Imaging Orders     DG C-Arm 1-60 Min-No Report  Procedure Orders     LUMBAR FACET(MEDIAL BRANCH NERVE BLOCK) MBNB  Medications ordered for procedure: Meds ordered this encounter  Medications  . lidocaine (XYLOCAINE) 2 % (with pres) injection 200 mg  . iopamidol (ISOVUE-M) 41 % intrathecal injection 10 mL  . dexamethasone (DECADRON) injection 10 mg  . sodium chloride flush (NS) 0.9 % injection 1 mL  . ropivacaine (PF) 2 mg/mL (0.2%) (NAROPIN) injection 1 mL   Medications administered: We administered lidocaine, iopamidol, dexamethasone, and sodium chloride flush.  See the medical record for exact dosing, route, and time of administration.  Disposition: Discharge home  Discharge Date & Time: 01/01/2018; 1314 hrs.   Physician-requested Follow-up: Return in about 4 weeks (around 01/29/2018) for Post Procedure Evaluation.  Future Appointments  Date Time Provider [place]  01/30/2018  8:45 AM Gillis Santa, MD [facility] None   Primary Care Physician: Kirk Ruths, MD Location: [facility] Note by: Gillis Santa, MD Date: 01/01/2018; Time: 2:55 PM  Disclaimer:  Medicine is not an exact science. The only guarantee in medicine is that nothing is guaranteed. It is important to note that the decision to proceed with this intervention was based on the information collected from the patient. The Data and conclusions were drawn from the patient's questionnaire, the interview, and the physical examination. Because the information was provided in large part by the patient, it cannot be guaranteed that it has not been purposely or unconsciously manipulated. Every effort has been  made to obtain as much relevant data as possible for  this evaluation. It is important to note that the conclusions that lead to this procedure are derived in large part from the available data. Always take into account that the treatment will also be dependent on availability of resources and existing treatment guidelines, considered by other Pain Management Practitioners as being common knowledge and practice, at the time of the intervention. For Medico-Legal purposes, it is also important to point out that variation in procedural techniques and pharmacological choices are the acceptable norm. The indications, contraindications, technique, and results of the above procedure should only be interpreted and judged by a Board-Certified Interventional Pain Specialist with extensive familiarity and expertise in the same exact procedure and technique.

## 2018-01-01 NOTE — Progress Notes (Deleted)
Patient's Name: Timothy Farley  MRN: [id number]  Referring Provider: Harvest Dark, FNP  DOB: [birth year]  PCP: Kirk Ruths, MD  DOS: 01/01/2018  Note by: Gillis Santa, MD  Service setting: Ambulatory outpatient  Specialty: Interventional Pain Management  Location: [facility] (AMB) Pain Management Facility    Patient type: New patient ("FAST-TRACK" Evaluation)   Warning: This referral option does not include the extensive pharmacological evaluation required for [place] to take over the patient's medication management. The "Fast-Track" system is designed to bypass the new patient referral waiting list, as well as the normal patient evaluation process, in order to provide a patient in distress with a timely pain management intervention. Because the system was not designed to unfairly get a patient into our pain practice ahead of those already waiting, certain restrictions apply. By requesting a "Fast-Track" consult, the referring physician has opted to continue managing the patient's medications in order to get interventional urgent care.  Primary Reason for Visit: Interventional Pain Management Treatment. CC: No chief complaint on file.   Procedure  HPI  Timothy Farley is a 42 y.o. year old, male patient, who comes today for a  "Fast-Track" new patient evaluation, as requested by Meeler, Sherren Kerns, FNP. The patient has been made aware that this type of referral option is reserved for the Interventional Pain Management portion of our practice and completely excludes the option of medication management. His primarily concern today is the No chief complaint on file.  Pain Assessment: Location:     Radiating:   Onset:   Duration:   Quality:   Severity:  /10 (subjective, self-reported pain score)  Note: Reported level is compatible with observation.                         When using our objective Pain Scale, levels between 6 and 10/10 are said to belong in an emergency room, as it progressively  worsens from a 6/10, described as severely limiting, requiring emergency care not usually available at an outpatient pain management facility. At a 6/10 level, communication becomes difficult and requires great effort. Assistance to reach the emergency department may be required. Facial flushing and profuse sweating along with potentially dangerous increases in heart rate and blood pressure will be evident. Effect on ADL:   Timing:   Modifying factors:   BP:    HR:    Onset and Duration: {Hx; Onset and Duration:210120511} Cause of pain: {Hx; Cause:210120521} Severity: {Pain Severity:210120502} Timing: {Symptoms; Timing:210120501} Aggravating Factors: {Causes; Aggravating pain factors:210120507} Alleviating Factors: {Causes; Alleviating Factors:210120500} Associated Problems: {Hx; Associated problems:210120515} Quality of Pain: {Hx; Symptom quality or Descriptor:210120531} Previous Examinations or Tests: {Hx; Previous examinations or test:210120529} Previous Treatments: {Hx; Previous Treatment:210120503}  The patient comes into the clinics today, referred to [place] for bilateral L5-S1 transforaminal ESI through Dr. Sharlet Salina with [facility].  Of note patient does have a history of traumatic brain injury and temporal lobectomy 1994 and has been having acute on chronic low back pain with radiation to right buttock and right lower extremity that is more frequent than the left.  Patient's lumbar spine x-ray from 12/29/2017 shows mild degenerative disc disease at L3-S1 most significantly at L5 and S1 and facet degenerative changes at L5-S1.  Meds   Current Outpatient Medications:  .  meclizine (ANTIVERT) 12.5 MG tablet, Take 12.5 mg by mouth 3 (three) times daily as needed for dizziness., Disp: , Rfl:  .  pantoprazole (PROTONIX) 40 MG tablet, Take 40  mg by mouth daily., Disp: , Rfl:  .  VALSARTAN PO, Take by mouth daily., Disp: , Rfl:   Imaging Review  Knee-R MR wo contrast:  Results for orders  placed during the hospital encounter of 10/31/16  MR KNEE RIGHT WO CONTRAST   Narrative CLINICAL DATA:  Right knee pain, weakness and buckling for 1 year. No known injury.  EXAM: MRI OF THE RIGHT KNEE WITHOUT CONTRAST  TECHNIQUE: Multiplanar, multisequence MR imaging of the knee was performed. No intravenous contrast was administered.  COMPARISON:  None.  FINDINGS: MENISCI  Medial meniscus:  Intact.  Lateral meniscus:  Intact.  LIGAMENTS  Cruciates:  Intact.  Collaterals:  Intact.  CARTILAGE  Patellofemoral: Thinning and mild underlying subchondral edema are seen in the inferior aspect of the medial femoral trochlea.  Medial:  Preserved.  Lateral:  Preserved.  Joint:  No effusion.  Popliteal Fossa:  No Baker's cyst.  Extensor Mechanism: Increased T2 signal in the quadriceps tendon at its attachment to the patella could be due to tendinosis or strain. No tear. The extensor mechanism is otherwise normal.  Bones: No fracture or worrisome lesion. Mild subchondral edema is seen in the femoral trochlea subjacent to cartilage loss described above.  Other: None.  IMPRESSION: Edema in the distal quadriceps tendon compatible with tendinosis or strain. No tear.  Cartilage thinning in the inferior aspect of the medial femoral trochlea with underlying mild subchondral edema.  Negative for meniscal or ligament tear.   Electronically Signed   By: Inge Rise M.D.   On: 10/31/2016 15:53      Complexity Note: Imaging results reviewed. Results shared with [name], using Layman's terms.                         ROS  Cardiovascular: {Hx; Cardiovascular History:210120525} Pulmonary or Respiratory: {Hx; Pumonary and/or Respiratory History:210120523} Neurological: {Hx; Neurological:210120504} Review of Past Neurological Studies: No results found for this or any previous visit. Psychological-Psychiatric: {Hx; Psychological-Psychiatric  History:210120512} Gastrointestinal: {Hx; Gastrointestinal:210120527} Genitourinary: {Hx; Genitourinary:210120506} Hematological: {Hx; Hematological:210120510} Endocrine: {Hx; Endocrine history:210120509} Rheumatologic: {Hx; Rheumatological:210120530} Musculoskeletal: {Hx; Musculoskeletal:210120528} Work History: {Hx; Work history:210120514}  Allergies  Mr. Youman is allergic to penicillins.  Laboratory Chemistry  Inflammation Markers (CRP: Acute Phase) (ESR: Chronic Phase) No results found for: CRP, ESRSEDRATE, LATICACIDVEN                       Rheumatology Markers No results found for: RF, ANA, LABURIC, URICUR, LYMEIGGIGMAB, LYMEABIGMQN, HLAB27                      Renal Function Markers No results found for: BUN, CREATININE, BCR, GFRAA, GFRNONAA                           Hepatic Function Markers No results found for: AST, ALT, ALBUMIN, ALKPHOS, HCVAB, AMYLASE, LIPASE, AMMONIA                      Electrolytes No results found for: NA, K, CL, CALCIUM, MG, PHOS                      Neuropathy Markers No results found for: VITAMINB12, FOLATE, HGBA1C, HIV                      CNS Tests No results found for: COLORCSF, APPEARCSF, RBCCOUNTCSF,  WBCCSF, POLYSCSF, LYMPHSCSF, EOSCSF, PROTEINCSF, GLUCCSF, JCVIRUS, CSFOLI, IGGCSF                      Bone Pathology Markers No results found for: VD25OH, VD125OH2TOT, VD3125OH2, VD2125OH2, 25OHVITD1, 25OHVITD2, 25OHVITD3, TESTOFREE, TESTOSTERONE                       Coagulation Parameters No results found for: INR, LABPROT, APTT, PLT, DDIMER                      Cardiovascular Markers No results found for: BNP, CKTOTAL, CKMB, TROPONINI, HGB, HCT                       CA Markers No results found for: CEA, CA125, LABCA2                      Note: Lab results reviewed.  PFSH  Drug: Mr. Tindall  has no drug history on file. Alcohol:  has no alcohol history on file. Tobacco:  reports that he has never smoked. He has never used  smokeless tobacco. Medical:  has a past medical history of TBI (traumatic brain injury) (HCC) (1994). Family: family history is not on file.  No past surgical history on file. Active Ambulatory Problems    Diagnosis Date Noted  . No Active Ambulatory Problems   Resolved Ambulatory Problems    Diagnosis Date Noted  . No Resolved Ambulatory Problems   Past Medical History:  Diagnosis Date  . TBI (traumatic brain injury) (HCC) 1994   Constitutional Exam  General appearance: Well nourished, well developed, and well hydrated. In no apparent acute distress There were no vitals filed for this visit. BMI Assessment: There is no height or weight on file to calculate BMI.  BMI interpretation table: BMI level Category Range association with higher incidence of chronic pain  <18 kg/m2 Underweight   18.5-24.9 kg/m2 Ideal body weight   25-29.9 kg/m2 Overweight Increased incidence by 20%  30-34.9 kg/m2 Obese (Class I) Increased incidence by 68%  35-39.9 kg/m2 Severe obesity (Class II) Increased incidence by 136%  >40 kg/m2 Extreme obesity (Class III) Increased incidence by 254%   Patient's current BMI Ideal Body weight  There is no height or weight on file to calculate BMI. Patient weight not recorded   BMI Readings from Last 4 Encounters:  No data found for BMI   Wt Readings from Last 4 Encounters:  No data found for Wt  Psych/Mental status: Alert, oriented x 3 (person, place, & time)       Eyes: PERLA Respiratory: No evidence of acute respiratory distress  Lumbar Spine Area Exam  Skin & Axial Inspection: No masses, redness, or swelling Alignment: Symmetrical Functional ROM: Decreased ROM       Stability: No instability detected Muscle Tone/Strength: Functionally intact. No obvious neuro-muscular anomalies detected. Sensory (Neurological): Musculoskeletal pain pattern Palpation: No palpable anomalies       Provocative Tests: Hyperextension/rotation test: deferred today        Lumbar quadrant test (Kemp's test): (+) due to pain. Lateral bending test: (+) due to pain. Patrick's Maneuver: deferred today                   FABER test: deferred today                   S-I anterior distraction/compression test: deferred today           S-I lateral compression test: deferred today         S-I Thigh-thrust test: deferred today         S-I Gaenslen's test: deferred today          Gait & Posture Assessment  Ambulation: Unassisted Gait: Relatively normal for age and body habitus Posture: WNL   Lower Extremity Exam    Side: Right lower extremity  Side: Left lower extremity  Stability: No instability observed          Stability: No instability observed          Skin & Extremity Inspection: Skin color, temperature, and hair growth are WNL. No peripheral edema or cyanosis. No masses, redness, swelling, asymmetry, or associated skin lesions. No contractures.  Skin & Extremity Inspection: Skin color, temperature, and hair growth are WNL. No peripheral edema or cyanosis. No masses, redness, swelling, asymmetry, or associated skin lesions. No contractures.  Functional ROM: Unrestricted ROM                  Functional ROM: Unrestricted ROM                  Muscle Tone/Strength: Functionally intact. No obvious neuro-muscular anomalies detected.  Muscle Tone/Strength: Functionally intact. No obvious neuro-muscular anomalies detected.  Sensory (Neurological): Unimpaired  Sensory (Neurological): Unimpaired  Palpation: No palpable anomalies  Palpation: No palpable anomalies    Procedure  42 year old male who was referred from Dr. Sharlet Salina as a fast track for bilateral L5-S1 transforaminal epidural steroid injections for axial low back pain with radiation into right greater than left buttock and right lower extremity.  Risks and benefits of this procedure have been discussed and patient would like to proceed.

## 2018-01-30 ENCOUNTER — Encounter: Payer: Self-pay | Admitting: Student in an Organized Health Care Education/Training Program

## 2018-01-30 ENCOUNTER — Ambulatory Visit
Payer: Medicare Other | Attending: Student in an Organized Health Care Education/Training Program | Admitting: Student in an Organized Health Care Education/Training Program

## 2018-01-30 ENCOUNTER — Other Ambulatory Visit: Payer: Self-pay

## 2018-01-30 VITALS — BP 128/87 | HR 85 | Temp 98.3°F | Resp 18 | Ht 73.0 in | Wt 246.0 lb

## 2018-01-30 DIAGNOSIS — M545 Low back pain, unspecified: Secondary | ICD-10-CM

## 2018-01-30 DIAGNOSIS — S069X0S Unspecified intracranial injury without loss of consciousness, sequela: Secondary | ICD-10-CM

## 2018-01-30 DIAGNOSIS — Z87820 Personal history of traumatic brain injury: Secondary | ICD-10-CM | POA: Diagnosis not present

## 2018-01-30 DIAGNOSIS — G894 Chronic pain syndrome: Secondary | ICD-10-CM

## 2018-01-30 DIAGNOSIS — M47816 Spondylosis without myelopathy or radiculopathy, lumbar region: Secondary | ICD-10-CM

## 2018-01-30 DIAGNOSIS — G8929 Other chronic pain: Secondary | ICD-10-CM

## 2018-01-30 DIAGNOSIS — F068 Other specified mental disorders due to known physiological condition: Secondary | ICD-10-CM

## 2018-01-30 DIAGNOSIS — M1711 Unilateral primary osteoarthritis, right knee: Secondary | ICD-10-CM | POA: Diagnosis not present

## 2018-01-30 DIAGNOSIS — Z79899 Other long term (current) drug therapy: Secondary | ICD-10-CM | POA: Diagnosis not present

## 2018-01-30 DIAGNOSIS — R4189 Other symptoms and signs involving cognitive functions and awareness: Secondary | ICD-10-CM

## 2018-01-30 NOTE — Progress Notes (Signed)
Safety precautions to be maintained throughout the outpatient stay will include: orient to surroundings, keep bed in low position, maintain call bell within reach at all times, provide assistance with transfer out of bed and ambulation.  

## 2018-01-30 NOTE — Patient Instructions (Signed)
Moderate Conscious Sedation, Adult Sedation is the use of medicines to promote relaxation and relieve discomfort and anxiety. Moderate conscious sedation is a type of sedation. Under moderate conscious sedation, you are less alert than normal, but you are still able to respond to instructions, touch, or both. Moderate conscious sedation is used during short medical and dental procedures. It is milder than deep sedation, which is a type of sedation under which you cannot be easily woken up. It is also milder than general anesthesia, which is the use of medicines to make you unconscious. Moderate conscious sedation allows you to return to your regular activities sooner. Tell a health care provider about:  Any allergies you have.  All medicines you are taking, including vitamins, herbs, eye drops, creams, and over-the-counter medicines.  Use of steroids (by mouth or creams).  Any problems you or family members have had with sedatives and anesthetic medicines.  Any blood disorders you have.  Any surgeries you have had.  Any medical conditions you have, such as sleep apnea.  Whether you are pregnant or may be pregnant.  Any use of cigarettes, alcohol, marijuana, or street drugs. What are the risks? Generally, this is a safe procedure. However, problems may occur, including:  Getting too much medicine (oversedation).  Nausea.  Allergic reaction to medicines.  Trouble breathing. If this happens, a breathing tube may be used to help with breathing. It will be removed when you are awake and breathing on your own.  Heart trouble.  Lung trouble.  What happens before the procedure? Staying hydrated Follow instructions from your health care provider about hydration, which may include:  Up to 2 hours before the procedure - you may continue to drink clear liquids, such as water, clear fruit juice, black coffee, and plain tea.  Eating and drinking restrictions Follow instructions from  your health care provider about eating and drinking, which may include:  8 hours before the procedure - stop eating heavy meals or foods such as meat, fried foods, or fatty foods.  6 hours before the procedure - stop eating light meals or foods, such as toast or cereal.  6 hours before the procedure - stop drinking milk or drinks that contain milk.  2 hours before the procedure - stop drinking clear liquids.  Medicine  Ask your health care provider about:  Changing or stopping your regular medicines. This is especially important if you are taking diabetes medicines or blood thinners.  Taking medicines such as aspirin and ibuprofen. These medicines can thin your blood. Do not take these medicines before your procedure if your health care provider instructs you not to.  Tests and exams  You will have a physical exam.  You may have blood tests done to show: ? How well your kidneys and liver are working. ? How well your blood can clot. General instructions  Plan to have someone take you home from the hospital or clinic.  If you will be going home right after the procedure, plan to have someone with you for 24 hours. What happens during the procedure?  An IV tube will be inserted into one of your veins.  Medicine to help you relax (sedative) will be given through the IV tube.  The medical or dental procedure will be performed. What happens after the procedure?  Your blood pressure, heart rate, breathing rate, and blood oxygen level will be monitored often until the medicines you were given have worn off.  Do not drive for 24 hours.   This information is not intended to replace advice given to you by your health care provider. Make sure you discuss any questions you have with your health care provider. Document Released: 11/21/2000 Document Revised: 08/02/2015 Document Reviewed: 06/18/2015 [employer] Interactive Patient Education  2018 [employer] GENERAL RISKS AND  COMPLICATIONS  What are the risk, side effects and possible complications? Generally speaking, most procedures are safe.  However, with any procedure there are risks, side effects, and the possibility of complications.  The risks and complications are dependent upon the sites that are lesioned, or the type of nerve block to be performed.  The closer the procedure is to the spine, the more serious the risks are.  Great care is taken when placing the radio frequency needles, block needles or lesioning probes, but sometimes complications can occur. 1. Infection: Any time there is an injection through the skin, there is a risk of infection.  This is why sterile conditions are used for these blocks.  There are four possible types of infection. 1. Localized skin infection. 2. Central Nervous System Infection-This can be in the form of Meningitis, which can be deadly. 3. Epidural Infections-This can be in the form of an epidural abscess, which can cause pressure inside of the spine, causing compression of the spinal cord with subsequent paralysis. This would require an emergency surgery to decompress, and there are no guarantees that the patient would recover from the paralysis. 4. Discitis-This is an infection of the intervertebral discs.  It occurs in about 1% of discography procedures.  It is difficult to treat and it may lead to surgery.        2. Pain: the needles have to go through skin and soft tissues, will cause soreness.       3. Damage to internal structures:  The nerves to be lesioned may be near blood vessels or    other nerves which can be potentially damaged.       4. Bleeding: Bleeding is more common if the patient is taking blood thinners such as  aspirin, Coumadin, Ticiid, Plavix, etc., or if he/she have some genetic predisposition  such as hemophilia. Bleeding into the spinal canal can cause compression of the spinal  cord with subsequent paralysis.  This would require an emergency surgery  to  decompress and there are no guarantees that the patient would recover from the  paralysis.       5. Pneumothorax:  Puncturing of a lung is a possibility, every time a needle is introduced in  the area of the chest or upper back.  Pneumothorax refers to free air around the  collapsed lung(s), inside of the thoracic cavity (chest cavity).  Another two possible  complications related to a similar event would include: Hemothorax and Chylothorax.   These are variations of the Pneumothorax, where instead of air around the collapsed  lung(s), you may have blood or chyle, respectively.       6. Spinal headaches: They may occur with any procedures in the area of the spine.       7. Persistent CSF (Cerebro-Spinal Fluid) leakage: This is a rare problem, but may occur  with prolonged intrathecal or epidural catheters either due to the formation of a fistulous  track or a dural tear.       8. Nerve damage: By working so close to the spinal cord, there is always a possibility of  nerve damage, which could be as serious as a permanent spinal cord injury with    paralysis.       9. Death:  Although rare, severe deadly allergic reactions known as "Anaphylactic  reaction" can occur to any of the medications used.      10. Worsening of the symptoms:  We can always make thing worse.  What are the chances of something like this happening? Chances of any of this occuring are extremely low.  By statistics, you have more of a chance of getting killed in a motor vehicle accident: while driving to the hospital than any of the above occurring .  Nevertheless, you should be aware that they are possibilities.  In general, it is similar to taking a shower.  Everybody knows that you can slip, hit your head and get killed.  Does that mean that you should not shower again?  Nevertheless always keep in mind that statistics do not mean anything if you happen to be on the wrong side of them.  Even if a procedure has a 1 (one) in a 1,000,000  (million) chance of going wrong, it you happen to be that one..Also, keep in mind that by statistics, you have more of a chance of having something go wrong when taking medications.  Who should not have this procedure? If you are on a blood thinning medication (e.g. Coumadin, Plavix, see list of "Blood Thinners"), or if you have an active infection going on, you should not have the procedure.  If you are taking any blood thinners, please inform your physician.  How should I prepare for this procedure?  Do not eat or drink anything at least six hours prior to the procedure.  Bring a driver with you .  It cannot be a taxi.  Come accompanied by an adult that can drive you back, and that is strong enough to help you if your legs get weak or numb from the local anesthetic.  Take all of your medicines the morning of the procedure with just enough water to swallow them.  If you have diabetes, make sure that you are scheduled to have your procedure done first thing in the morning, whenever possible.  If you have diabetes, take only half of your insulin dose and notify our nurse that you have done so as soon as you arrive at the clinic.  If you are diabetic, but only take blood sugar pills (oral hypoglycemic), then do not take them on the morning of your procedure.  You may take them after you have had the procedure.  Do not take aspirin or any aspirin-containing medications, at least eleven (11) days prior to the procedure.  They may prolong bleeding.  Wear loose fitting clothing that may be easy to take off and that you would not mind if it got stained with Betadine or blood.  Do not wear any jewelry or perfume  Remove any nail coloring.  It will interfere with some of our monitoring equipment.  NOTE: Remember that this is not meant to be interpreted as a complete list of all possible complications.  Unforeseen problems may occur.  BLOOD THINNERS The following drugs contain aspirin or other  products, which can cause increased bleeding during surgery and should not be taken for 2 weeks prior to and 1 week after surgery.  If you should need take something for relief of minor pain, you may take acetaminophen which is found in Tylenol,m Datril, Anacin-3 and Panadol. It is not blood thinner. The products listed below are.  Do not take any of the products listed   below in addition to any listed on your instruction sheet.  A.P.C or A.P.C with Codeine Codeine Phosphate Capsules #3 Ibuprofen Ridaura  ABC compound Congesprin Imuran rimadil  Advil Cope Indocin Robaxisal  Alka-Seltzer Effervescent Pain Reliever and Antacid Coricidin or Coricidin-D  Indomethacin Rufen  Alka-Seltzer plus Cold Medicine Cosprin Ketoprofen S-A-C Tablets  Anacin Analgesic Tablets or Capsules Coumadin Korlgesic Salflex  Anacin Extra Strength Analgesic tablets or capsules CP-2 Tablets Lanoril Salicylate  Anaprox Cuprimine Capsules Levenox Salocol  Anexsia-D Dalteparin Magan Salsalate  Anodynos Darvon compound Magnesium Salicylate Sine-off  Ansaid Dasin Capsules Magsal Sodium Salicylate  Anturane Depen Capsules Marnal Soma  APF Arthritis pain formula Dewitt's Pills Measurin Stanback  Argesic Dia-Gesic Meclofenamic Sulfinpyrazone  Arthritis Bayer Timed Release Aspirin Diclofenac Meclomen Sulindac  Arthritis pain formula Anacin Dicumarol Medipren Supac  Analgesic (Safety coated) Arthralgen Diffunasal Mefanamic Suprofen  Arthritis Strength Bufferin Dihydrocodeine Mepro Compound Suprol  Arthropan liquid Dopirydamole Methcarbomol with Aspirin Synalgos  ASA tablets/Enseals Disalcid Micrainin Tagament  Ascriptin Doan's Midol Talwin  Ascriptin A/D Dolene Mobidin Tanderil  Ascriptin Extra Strength Dolobid Moblgesic Ticlid  Ascriptin with Codeine Doloprin or Doloprin with Codeine Momentum Tolectin  Asperbuf Duoprin Mono-gesic Trendar  Aspergum Duradyne Motrin or Motrin IB Triminicin  Aspirin plain, buffered or enteric  coated Durasal Myochrisine Trigesic  Aspirin Suppositories Easprin Nalfon Trillsate  Aspirin with Codeine Ecotrin Regular or Extra Strength Naprosyn Uracel  Atromid-S Efficin Naproxen Ursinus  Auranofin Capsules Elmiron Neocylate Vanquish  Axotal Emagrin Norgesic Verin  Azathioprine Empirin or Empirin with Codeine Normiflo Vitamin E  Azolid Emprazil Nuprin Voltaren  Bayer Aspirin plain, buffered or children's or timed BC Tablets or powders Encaprin Orgaran Warfarin Sodium  Buff-a-Comp Enoxaparin Orudis Zorpin  Buff-a-Comp with Codeine Equegesic Os-Cal-Gesic   Buffaprin Excedrin plain, buffered or Extra Strength Oxalid   Bufferin Arthritis Strength Feldene Oxphenbutazone   Bufferin plain or Extra Strength Feldene Capsules Oxycodone with Aspirin   Bufferin with Codeine Fenoprofen Fenoprofen Pabalate or Pabalate-SF   Buffets II Flogesic Panagesic   Buffinol plain or Extra Strength Florinal or Florinal with Codeine Panwarfarin   Buf-Tabs Flurbiprofen Penicillamine   Butalbital Compound Four-way cold tablets Penicillin   Butazolidin Fragmin Pepto-Bismol   Carbenicillin Geminisyn Percodan   Carna Arthritis Reliever Geopen Persantine   Carprofen Gold's salt Persistin   Chloramphenicol Goody's Phenylbutazone   Chloromycetin Haltrain Piroxlcam   Clmetidine heparin Plaquenil   Cllnoril Hyco-pap Ponstel   Clofibrate Hydroxy chloroquine Propoxyphen         Before stopping any of these medications, be sure to consult the physician who ordered them.  Some, such as Coumadin (Warfarin) are ordered to prevent or treat serious conditions such as "deep thrombosis", "pumonary embolisms", and other heart problems.  The amount of time that you may need off of the medication may also vary with the medication and the reason for which you were taking it.  If you are taking any of these medications, please make sure you notify your pain physician before you undergo any procedures.         Facet  Blocks Patient Information  Description: The facets are joints in the spine between the vertebrae.  Like any joints in the body, facets can become irritated and painful.  Arthritis can also effect the facets.  By injecting steroids and local anesthetic in and around these joints, we can temporarily block the nerve supply to them.  Steroids act directly on irritated nerves and tissues to reduce selling and inflammation which often leads to decreased pain.    Facet blocks may be done anywhere along the spine from the neck to the low back depending upon the location of your pain.   After numbing the skin with local anesthetic (like Novocaine), a small needle is passed onto the facet joints under x-ray guidance.  You may experience a sensation of pressure while this is being done.  The entire block usually lasts about 15-25 minutes.   Conditions which may be treated by facet blocks:   Low back/buttock pain  Neck/shoulder pain  Certain types of headaches  Preparation for the injection:  1. Do not eat any solid food or dairy products within 8 hours of your appointment. 2. You may drink clear liquid up to 3 hours before appointment.  Clear liquids include water, black coffee, juice or soda.  No milk or cream please. 3. You may take your regular medication, including pain medications, with a sip of water before your appointment.  Diabetics should hold regular insulin (if taken separately) and take 1/2 normal NPH dose the morning of the procedure.  Carry some sugar containing items with you to your appointment. 4. A driver must accompany you and be prepared to drive you home after your procedure. 5. Bring all your current medications with you. 6. An IV may be inserted and sedation may be given at the discretion of the physician. 7. A blood pressure cuff, EKG and other monitors will often be applied during the procedure.  Some patients may need to have extra oxygen administered for a short period. 8. You  will be asked to provide medical information, including your allergies and medications, prior to the procedure.  We must know immediately if you are taking blood thinners (like Coumadin/Warfarin) or if you are allergic to IV iodine contrast (dye).  We must know if you could possible be pregnant.  Possible side-effects:   Bleeding from needle site  Infection (rare, may require surgery)  Nerve injury (rare)  Numbness & tingling (temporary)  Difficulty urinating (rare, temporary)  Spinal headache (a headache worse with upright posture)  Light-headedness (temporary)  Pain at injection site (serveral days)  Decreased blood pressure (rare, temporary)  Weakness in arm/leg (temporary)  Pressure sensation in back/neck (temporary)   Call if you experience:   Fever/chills associated with headache or increased back/neck pain  Headache worsened by an upright position  New onset, weakness or numbness of an extremity below the injection site  Hives or difficulty breathing (go to the emergency room)  Inflammation or drainage at the injection site(s)  Severe back/neck pain greater than usual  New symptoms which are concerning to you  Please note:  Although the local anesthetic injected can often make your back or neck feel good for several hours after the injection, the pain will likely return. It takes 3-7 days for steroids to work.  You may not notice any pain relief for at least one week.  If effective, we will often do a series of 2-3 injections spaced 3-6 weeks apart to maximally decrease your pain.  After the initial series, you may be a candidate for a more permanent nerve block of the facets.  If you have any questions, please call [phone]  [facility] 

## 2018-01-30 NOTE — Progress Notes (Signed)
Patient's Name: Timothy Farley  MRN: [id number]  Referring Provider: Kirk Ruths, MD  DOB: 07-Oct-1975  PCP: Kirk Ruths, MD  DOS: 01/30/2018  Note by: Gillis Santa, MD  Service setting: Ambulatory outpatient  Specialty: Interventional Pain Management  Location: [facility] (AMB) Pain Management Facility    Patient type: Established   Primary Reason(s) for Visit: Encounter for post-procedure evaluation of chronic illness with mild to moderate exacerbation CC: Back Pain (lower, right side is worse)  HPI  Timothy Farley is a 42 y.o. year old, male patient, who comes today for a post-procedure evaluation. He has Ataxia; Cognitive deficit as late effect of traumatic brain injury ([place]); History of traumatic brain injury; and Osteoarthritis of right knee on their problem list. His primarily concern today is the Back Pain (lower, right side is worse)  Pain Assessment: Location: Lower(right side is worse) Back Radiating: denies Onset: More than a month ago Duration: Chronic pain Quality: Aching, Sharp, Constant("I don't feel pain to the extent I did before the procedure.") Severity: 7 /10 (subjective, self-reported pain score)  Note: Reported level is inconsistent with clinical observations.                         When using our objective Pain Scale, levels between 6 and 10/10 are said to belong in an emergency room, as it progressively worsens from a 6/10, described as severely limiting, requiring emergency care not usually available at an outpatient pain management facility. At a 6/10 level, communication becomes difficult and requires great effort. Assistance to reach the emergency department may be required. Facial flushing and profuse sweating along with potentially dangerous increases in heart rate and blood pressure will be evident. Effect on ADL: limits daily activities Timing: Constant Modifying factors: "nothing helps" BP: 128/87  HR: 85  Timothy Farley comes in today for  post-procedure evaluation.  Further details on both, my assessment(s), as well as the proposed treatment plan, please see below.  Post-Procedure Assessment  01/01/2018 Procedure: Bilateral L3, L4, L5 facet medial branch nerve block Pre-procedure pain score:  6/10 Post-procedure pain score: 0/10         Influential Factors: BMI: 32.46 kg/m Intra-procedural challenges: None observed.         Assessment challenges: None detected.              Reported side-effects: None.        Post-procedural adverse reactions or complications: None reported         Sedation: Please see nurses note. When no sedatives are used, the analgesic levels obtained are directly associated to the effectiveness of the local anesthetics. However, when sedation is provided, the level of analgesia obtained during the initial 1 hour following the intervention, is believed to be the result of a combination of factors. These factors may include, but are not limited to: 1. The effectiveness of the local anesthetics used. 2. The effects of the analgesic(s) and/or anxiolytic(s) used. 3. The degree of discomfort experienced by the patient at the time of the procedure. 4. The patients ability and reliability in recalling and recording the events. 5. The presence and influence of possible secondary gains and/or psychosocial factors. Reported result: Relief experienced during the 1st hour after the procedure:100%   (Ultra-Short Term Relief)            Interpretative annotation: Clinically appropriate result. Analgesia during this period is likely to be Local Anesthetic and/or IV Sedative (Analgesic/Anxiolytic) related.  Effects of local anesthetic: The analgesic effects attained during this period are directly associated to the localized infiltration of local anesthetics and therefore cary significant diagnostic value as to the etiological location, or anatomical origin, of the pain. Expected duration of relief is directly  dependent on the pharmacodynamics of the local anesthetic used. Long-acting (4-6 hours) anesthetics used.  Reported result: Relief during the next 4 to 6 hour after the procedure:100%   (Short-Term Relief)            Interpretative annotation: Clinically appropriate result. Analgesia during this period is likely to be Local Anesthetic-related.          Long-term benefit: Defined as the period of time past the expected duration of local anesthetics (1 hour for short-acting and 4-6 hours for long-acting). With the possible exception of prolonged sympathetic blockade from the local anesthetics, benefits during this period are typically attributed to, or associated with, other factors such as analgesic sensory neuropraxia, antiinflammatory effects, or beneficial biochemical changes provided by agents other than the local anesthetics.  Reported result: Extended relief following procedure:30% ("My expectations were not met, today I am feeling more pain than I did the day of or the day after; the pain never went away, it was lessened some; I have been able to walk up stairs without pain and able to get up from bed and recliner on first attempt.) (Long-Term Relief)            Interpretative annotation: Clinically possible results. Good relief. No permanent benefit expected. Inflammation plays a part in the etiology to the pain.          Current benefits: Defined as reported results that persistent at this point in time.   Analgesia: 25 %            Function: Timothy Farley reports improvement in function ROM: Timothy Farley reports improvement in ROM Interpretative annotation: Recurrence of symptoms. No permanent benefit expected. Effective diagnostic intervention.          Interpretation: Results would suggest that repeating the procedure may be necessary,                  Plan:  Repeat treatment or therapy and compare extent and duration of benefits.                Laboratory Chemistry  Inflammation  Markers (CRP: Acute Phase) (ESR: Chronic Phase) No results found for: CRP, ESRSEDRATE, LATICACIDVEN                       Rheumatology Markers No results found for: RF, ANA, LABURIC, URICUR, LYMEIGGIGMAB, LYMEABIGMQN, HLAB27                      Renal Function Markers No results found for: BUN, CREATININE, BCR, GFRAA, GFRNONAA                           Hepatic Function Markers No results found for: AST, ALT, ALBUMIN, ALKPHOS, HCVAB, AMYLASE, LIPASE, AMMONIA                      Electrolytes No results found for: NA, K, CL, CALCIUM, MG, PHOS                      Neuropathy Markers No results found for: VITAMINB12, FOLATE, HGBA1C, HIV  CNS Tests No results found for: COLORCSF, APPEARCSF, RBCCOUNTCSF, WBCCSF, POLYSCSF, LYMPHSCSF, EOSCSF, PROTEINCSF, GLUCCSF, JCVIRUS, CSFOLI, IGGCSF                      Bone Pathology Markers No results found for: VD25OH, [id number], [id number], EU2353IR4, 25OHVITD1, 25OHVITD2, 25OHVITD3, TESTOFREE, TESTOSTERONE                       Coagulation Parameters No results found for: INR, LABPROT, APTT, PLT, DDIMER, LABHEMA                      Cardiovascular Markers No results found for: BNP, CKTOTAL, CKMB, TROPONINI, HGB, HCT                       CA Markers No results found for: CEA, CA125, LABCA2                      Note: Lab results reviewed.  Recent Diagnostic Imaging Results  DG C-Arm 1-60 Min-No Report Fluoroscopy was utilized by the requesting physician.  No radiographic  interpretation.   Complexity Note: Imaging results reviewed. Results shared with Timothy Farley, using Layman's terms.                         Meds   Current Outpatient Medications:  .  Omega-3 Fatty Acids (FISH OIL) 1000 MG CAPS, Take by mouth., Disp: , Rfl:  .  pantoprazole (PROTONIX) 40 MG tablet, Take 40 mg by mouth daily., Disp: , Rfl:  .  meclizine (ANTIVERT) 12.5 MG tablet, Take 12.5 mg by mouth 3 (three) times daily as needed for dizziness.,  Disp: , Rfl:  .  VALSARTAN PO, Take by mouth daily., Disp: , Rfl:   ROS  Constitutional: Denies any fever or chills Gastrointestinal: No reported hemesis, hematochezia, vomiting, or acute GI distress Musculoskeletal: Denies any acute onset joint swelling, redness, loss of ROM, or weakness Neurological: No reported episodes of acute onset apraxia, aphasia, dysarthria, agnosia, amnesia, paralysis, loss of coordination, or loss of consciousness  Allergies  Timothy Farley is allergic to penicillins.  [place]  Drug: Timothy Farley  has no drug history on file. Alcohol:  has no alcohol history on file. Tobacco:  reports that he has never smoked. He has never used smokeless tobacco. Medical:  has a past medical history of TBI (traumatic brain injury) ([place]) (1994). Surgical: Timothy Farley  has no past surgical history on file. Family: family history is not on file.  Constitutional Exam  General appearance: Well nourished, well developed, and well hydrated. In no apparent acute distress Vitals:   01/30/18 1229  BP: 128/87  Pulse: 85  Resp: 18  Temp: 98.3 F (36.8 C)  TempSrc: Oral  SpO2: 98%  Weight: 246 lb (111.6 kg)  Height: '6\' 1"'$  (1.854 m)   BMI Assessment: Estimated body mass index is 32.46 kg/m as calculated from the following:   Height as of this encounter: '6\' 1"'$  (1.854 m).   Weight as of this encounter: 246 lb (111.6 kg).  BMI interpretation table: BMI level Category Range association with higher incidence of chronic pain  <18 kg/m2 Underweight   18.5-24.9 kg/m2 Ideal body weight   25-29.9 kg/m2 Overweight Increased incidence by 20%  30-34.9 kg/m2 Obese (Class I) Increased incidence by 68%  35-39.9 kg/m2 Severe obesity (Class II) Increased incidence by 136%  >40 kg/m2 Extreme  obesity (Class III) Increased incidence by 254%   Patient's current BMI Ideal Body weight  Body mass index is 32.46 kg/m. Ideal body weight: 79.9 kg (176 lb 2.4 oz) Adjusted ideal body weight: 92.6 kg (204  lb 1.4 oz)   BMI Readings from Last 4 Encounters:  01/30/18 32.46 kg/m  01/01/18 32.79 kg/m   Wt Readings from Last 4 Encounters:  01/30/18 246 lb (111.6 kg)  01/01/18 248 lb 8 oz (112.7 kg)  Psych/Mental status: Alert, oriented x 3 (person, place, & time)       Eyes: PERLA Respiratory: No evidence of acute respiratory distress  Cervical Spine Area Exam  Skin & Axial Inspection: No masses, redness, edema, swelling, or associated skin lesions Alignment: Symmetrical Functional ROM: Unrestricted ROM      Stability: No instability detected Muscle Tone/Strength: Functionally intact. No obvious neuro-muscular anomalies detected. Sensory (Neurological): Unimpaired Palpation: No palpable anomalies              Upper Extremity (UE) Exam    Side: Right upper extremity  Side: Left upper extremity  Skin & Extremity Inspection: Skin color, temperature, and hair growth are WNL. No peripheral edema or cyanosis. No masses, redness, swelling, asymmetry, or associated skin lesions. No contractures.  Skin & Extremity Inspection: Skin color, temperature, and hair growth are WNL. No peripheral edema or cyanosis. No masses, redness, swelling, asymmetry, or associated skin lesions. No contractures.  Functional ROM: Unrestricted ROM          Functional ROM: Unrestricted ROM          Muscle Tone/Strength: Functionally intact. No obvious neuro-muscular anomalies detected.  Muscle Tone/Strength: Functionally intact. No obvious neuro-muscular anomalies detected.  Sensory (Neurological): Unimpaired          Sensory (Neurological): Unimpaired          Palpation: No palpable anomalies              Palpation: No palpable anomalies              Provocative Test(s):  Phalen's test: deferred Tinel's test: deferred Apley's scratch test (touch opposite shoulder):  Action 1 (Across chest): deferred Action 2 (Overhead): deferred Action 3 (LB reach): deferred   Provocative Test(s):  Phalen's test: deferred Tinel's  test: deferred Apley's scratch test (touch opposite shoulder):  Action 1 (Across chest): deferred Action 2 (Overhead): deferred Action 3 (LB reach): deferred    Thoracic Spine Area Exam  Skin & Axial Inspection: No masses, redness, or swelling Alignment: Symmetrical Functional ROM: Unrestricted ROM Stability: No instability detected Muscle Tone/Strength: Functionally intact. No obvious neuro-muscular anomalies detected. Sensory (Neurological): Unimpaired Muscle strength & Tone: No palpable anomalies  Lumbar Spine Area Exam  Skin & Axial Inspection: No masses, redness, or swelling Alignment: Symmetrical Functional ROM: Improved after treatment       Stability: No instability detected Muscle Tone/Strength: Functionally intact. No obvious neuro-muscular anomalies detected. Sensory (Neurological): Articular pain pattern Palpation: No palpable anomalies       Provocative Tests: Hyperextension/rotation test: (+) due to pain. Lumbar quadrant test (Kemp's test): (+) due to pain. Lateral bending test: deferred today       Patrick's Maneuver: deferred today                   FABER test: deferred today                   S-I anterior distraction/compression test: deferred today         S-I  lateral compression test: deferred today         S-I Thigh-thrust test: deferred today         S-I Gaenslen's test: deferred today          Gait & Posture Assessment  Ambulation: Unassisted Gait: Relatively normal for age and body habitus Posture: WNL   Lower Extremity Exam    Side: Right lower extremity  Side: Left lower extremity  Stability: No instability observed          Stability: No instability observed          Skin & Extremity Inspection: Skin color, temperature, and hair growth are WNL. No peripheral edema or cyanosis. No masses, redness, swelling, asymmetry, or associated skin lesions. No contractures.  Skin & Extremity Inspection: Skin color, temperature, and hair growth are WNL. No  peripheral edema or cyanosis. No masses, redness, swelling, asymmetry, or associated skin lesions. No contractures.  Functional ROM: Unrestricted ROM                  Functional ROM: Unrestricted ROM                  Muscle Tone/Strength: Functionally intact. No obvious neuro-muscular anomalies detected.  Muscle Tone/Strength: Functionally intact. No obvious neuro-muscular anomalies detected.  Sensory (Neurological): Unimpaired        Sensory (Neurological): Unimpaired        DTR: Patellar: deferred today Achilles: deferred today Plantar: deferred today  DTR: Patellar: deferred today Achilles: deferred today Plantar: deferred today  Palpation: No palpable anomalies  Palpation: No palpable anomalies   Assessment  Primary Diagnosis & Pertinent Problem List: The primary encounter diagnosis was Lumbar facet arthropathy (L5/S1). Diagnoses of Facet arthritis, degenerative, lumbar spine, Chronic midline low back pain without sciatica, Chronic pain syndrome, and Cognitive deficit as late effect of traumatic brain injury [facility]) were also pertinent to this visit.  Status Diagnosis  Responding Responding Persistent 1. Lumbar facet arthropathy (L5/S1)   2. Facet arthritis, degenerative, lumbar spine   3. Chronic midline low back pain without sciatica   4. Chronic pain syndrome   5. Cognitive deficit as late effect of traumatic brain injury [facility])      General Recommendations: The pain condition that the patient suffers from is best treated with a multidisciplinary approach that involves an increase in physical activity to prevent de-conditioning and worsening of the pain cycle, as well as psychological counseling (formal and/or informal) to address the co-morbid psychological affects of pain. Treatment will often involve judicious use of pain medications and interventional procedures to decrease the pain, allowing the patient to participate in the physical activity that will ultimately produce  long-lasting pain reductions. The goal of the multidisciplinary approach is to return the patient to a higher level of overall function and to restore their ability to perform activities of daily living.  42 year old male who follows up status post bilateral L3, L4, L5 facet medial branch nerve block for symptoms of axial low back pain related to lumbar facet arthropathy, lumbar spondylosis.  Patient endorses greater improvement in his functional status that he does his pain.  He states that he is able to get up out of his recliner in one attempt rather than 2-3 attempts in the past.  He states that proximally for the first 4 to 5 days he had 100% pain relief and has gradually had return of his axial low back pain.  We discussed repeating facet blocks with additional volume for his second  set.  Risks and benefits were discussed and patient would like to proceed.  Plan: -Bilateral L3, L4, L5 facet medial branch nerve block #2 with sedation under fluoroscopy.  Plan of Care  Lab-work, procedure(s), and/or referral(s): Orders Placed This Encounter  Procedures  . LUMBAR FACET(MEDIAL BRANCH NERVE BLOCK) MB[facility]    Time Note: Greater than 50% of the 25 minute(s) of face-to-face time spent with Timothy Farley, was spent in counseling/coordination of care regarding: Timothy Farley primary cause of pain, the treatment plan, treatment alternatives, the risks and possible complications of proposed treatment, going over the informed consent, the results, interpretation and significance of  his recent diagnostic interventional treatment(s) and realistic expectations.  Provider-requested follow-up: Return in about 4 days (around 02/03/2018) for Procedure.  Future Appointments  Date Time Provider [place]  02/03/2018 12:15 PM Gillis Santa, MD [facility] None    Primary Care Physician: Kirk Ruths, MD Location: [facility] Out[facility] Facility Note by: Gillis Santa, M.D Date: 01/30/2018;  Time: 1:22 PM  Patient Instructions   Moderate Conscious Sedation, Adult Sedation is the use of medicines to promote relaxation and relieve discomfort and anxiety. Moderate conscious sedation is a type of sedation. Under moderate conscious sedation, you are less alert than normal, but you are still able to respond to instructions, touch, or both. Moderate conscious sedation is used during short medical and dental procedures. It is milder than deep sedation, which is a type of sedation under which you cannot be easily woken up. It is also milder than general anesthesia, which is the use of medicines to make you unconscious. Moderate conscious sedation allows you to return to your regular activities sooner. Tell a health care provider about:  Any allergies you have.  All medicines you are taking, including vitamins, herbs, eye drops, creams, and over-the-counter medicines.  Use of steroids (by mouth or creams).  Any problems you or family members have had with sedatives and anesthetic medicines.  Any blood disorders you have.  Any surgeries you have had.  Any medical conditions you have, such as sleep apnea.  Whether you are pregnant or may be pregnant.  Any use of cigarettes, alcohol, marijuana, or street drugs. What are the risks? Generally, this is a safe procedure. However, problems may occur, including:  Getting too much medicine (oversedation).  Nausea.  Allergic reaction to medicines.  Trouble breathing. If this happens, a breathing tube may be used to help with breathing. It will be removed when you are awake and breathing on your own.  Heart trouble.  Lung trouble.  What happens before the procedure? Staying hydrated Follow instructions from your health care provider about hydration, which may include:  Up to 2 hours before the procedure - you may continue to drink clear liquids, such as water, clear fruit juice, black coffee, and plain tea.  Eating and drinking  restrictions Follow instructions from your health care provider about eating and drinking, which may include:  8 hours before the procedure - stop eating heavy meals or foods such as meat, fried foods, or fatty foods.  6 hours before the procedure - stop eating light meals or foods, such as toast or cereal.  6 hours before the procedure - stop drinking milk or drinks that contain milk.  2 hours before the procedure - stop drinking clear liquids.  Medicine  Ask your health care provider about:  Changing or stopping your regular medicines. This is especially important if you are taking diabetes medicines or blood thinners.  Taking medicines such as aspirin and ibuprofen. These medicines can thin your blood. Do not take these medicines before your procedure if your health care provider instructs you not to.  Tests and exams  You will have a physical exam.  You may have blood tests done to show: ? How well your kidneys and liver are working. ? How well your blood can clot. General instructions  Plan to have someone take you home from the hospital or clinic.  If you will be going home right after the procedure, plan to have someone with you for 24 hours. What happens during the procedure?  An IV tube will be inserted into one of your veins.  Medicine to help you relax (sedative) will be given through the IV tube.  The medical or dental procedure will be performed. What happens after the procedure?  Your blood pressure, heart rate, breathing rate, and blood oxygen level will be monitored often until the medicines you were given have worn off.  Do not drive for 24 hours. This information is not intended to replace advice given to you by your health care provider. Make sure you discuss any questions you have with your health care provider. Document Released: 11/21/2000 Document Revised: 08/02/2015 Document Reviewed: 06/18/2015 Elsevier Interactive Patient Education  2018  [place]  What are the risk, side effects and possible complications? Generally speaking, most procedures are safe.  However, with any procedure there are risks, side effects, and the possibility of complications.  The risks and complications are dependent upon the sites that are lesioned, or the type of nerve block to be performed.  The closer the procedure is to the spine, the more serious the risks are.  Great care is taken when placing the radio frequency needles, block needles or lesioning probes, but sometimes complications can occur. 1. Infection: Any time there is an injection through the skin, there is a risk of infection.  This is why sterile conditions are used for these blocks.  There are four possible types of infection. 1. Localized skin infection. 2. Central Nervous System Infection-This can be in the form of Meningitis, which can be deadly. 3. Epidural Infections-This can be in the form of an epidural abscess, which can cause pressure inside of the spine, causing compression of the spinal cord with subsequent paralysis. This would require an emergency surgery to decompress, and there are no guarantees that the patient would recover from the paralysis. 4. Discitis-This is an infection of the intervertebral discs.  It occurs in about 1% of discography procedures.  It is difficult to treat and it may lead to surgery.        2. Pain: the needles have to go through skin and soft tissues, will cause soreness.       3. Damage to internal structures:  The nerves to be lesioned may be near blood vessels or    other nerves which can be potentially damaged.       4. Bleeding: Bleeding is more common if the patient is taking blood thinners such as  aspirin, Coumadin, Ticiid, Plavix, etc., or if he/she have some genetic predisposition  such as hemophilia. Bleeding into the spinal canal can cause compression of the spinal  cord with subsequent paralysis.  This  would require an emergency surgery to  decompress and there are no guarantees that the patient would recover from the  paralysis.       5. Pneumothorax:  Puncturing of a  lung is a possibility, every time a needle is introduced in  the area of the chest or upper back.  Pneumothorax refers to free air around the  collapsed lung(s), inside of the thoracic cavity (chest cavity).  Another two possible  complications related to a similar event would include: Hemothorax and Chylothorax.   These are variations of the Pneumothorax, where instead of air around the collapsed  lung(s), you may have blood or chyle, respectively.       6. Spinal headaches: They may occur with any procedures in the area of the spine.       7. Persistent CSF (Cerebro-Spinal Fluid) leakage: This is a rare problem, but may occur  with prolonged intrathecal or epidural catheters either due to the formation of a fistulous  track or a dural tear.       8. Nerve damage: By working so close to the spinal cord, there is always a possibility of  nerve damage, which could be as serious as a permanent spinal cord injury with  paralysis.       9. Death:  Although rare, severe deadly allergic reactions known as "Anaphylactic  reaction" can occur to any of the medications used.      10. Worsening of the symptoms:  We can always make thing worse.  What are the chances of something like this happening? Chances of any of this occuring are extremely low.  By statistics, you have more of a chance of getting killed in a motor vehicle accident: while driving to the hospital than any of the above occurring .  Nevertheless, you should be aware that they are possibilities.  In general, it is similar to taking a shower.  Everybody knows that you can slip, hit your head and get killed.  Does that mean that you should not shower again?  Nevertheless always keep in mind that statistics do not mean anything if you happen to be on the wrong side of them.  Even if a  procedure has a 1 (one) in a 1,000,000 (million) chance of going wrong, it you happen to be that one..Also, keep in mind that by statistics, you have more of a chance of having something go wrong when taking medications.  Who should not have this procedure? If you are on a blood thinning medication (e.g. Coumadin, Plavix, see list of "Blood Thinners"), or if you have an active infection going on, you should not have the procedure.  If you are taking any blood thinners, please inform your physician.  How should I prepare for this procedure?  Do not eat or drink anything at least six hours prior to the procedure.  Bring a driver with you .  It cannot be a taxi.  Come accompanied by an adult that can drive you back, and that is strong enough to help you if your legs get weak or numb from the local anesthetic.  Take all of your medicines the morning of the procedure with just enough water to swallow them.  If you have diabetes, make sure that you are scheduled to have your procedure done first thing in the morning, whenever possible.  If you have diabetes, take only half of your insulin dose and notify our nurse that you have done so as soon as you arrive at the clinic.  If you are diabetic, but only take blood sugar pills (oral hypoglycemic), then do not take them on the morning of your procedure.  You may take them after  you have had the procedure.  Do not take aspirin or any aspirin-containing medications, at least eleven (11) days prior to the procedure.  They may prolong bleeding.  Wear loose fitting clothing that may be easy to take off and that you would not mind if it got stained with Betadine or blood.  Do not wear any jewelry or perfume  Remove any nail coloring.  It will interfere with some of our monitoring equipment.  NOTE: Remember that this is not meant to be interpreted as a complete list of all possible complications.  Unforeseen problems may occur.  BLOOD THINNERS The  following drugs contain aspirin or other products, which can cause increased bleeding during surgery and should not be taken for 2 weeks prior to and 1 week after surgery.  If you should need take something for relief of minor pain, you may take acetaminophen which is found in Tylenol,m Datril, Anacin-3 and Panadol. It is not blood thinner. The products listed below are.  Do not take any of the products listed below in addition to any listed on your instruction sheet.  A.P.C or A.P.C with Codeine Codeine Phosphate Capsules #3 Ibuprofen Ridaura  ABC compound Congesprin Imuran rimadil  Advil Cope Indocin Robaxisal  Alka-Seltzer Effervescent Pain Reliever and Antacid Coricidin or Coricidin-D  Indomethacin Rufen  Alka-Seltzer plus Cold Medicine Cosprin Ketoprofen S-A-C Tablets  Anacin Analgesic Tablets or Capsules Coumadin Korlgesic Salflex  Anacin Extra Strength Analgesic tablets or capsules CP-2 Tablets Lanoril Salicylate  Anaprox Cuprimine Capsules Levenox Salocol  Anexsia-D Dalteparin Magan Salsalate  Anodynos Darvon compound Magnesium Salicylate Sine-off  Ansaid Dasin Capsules Magsal Sodium Salicylate  Anturane Depen Capsules Marnal Soma  APF Arthritis pain formula Dewitt's Pills Measurin Stanback  Argesic Dia-Gesic Meclofenamic Sulfinpyrazone  Arthritis Bayer Timed Release Aspirin Diclofenac Meclomen Sulindac  Arthritis pain formula Anacin Dicumarol Medipren Supac  Analgesic (Safety coated) Arthralgen Diffunasal Mefanamic Suprofen  Arthritis Strength Bufferin Dihydrocodeine Mepro Compound Suprol  Arthropan liquid Dopirydamole Methcarbomol with Aspirin Synalgos  ASA tablets/Enseals Disalcid Micrainin Tagament  Ascriptin Doan's Midol Talwin  Ascriptin A/D Dolene Mobidin Tanderil  Ascriptin Extra Strength Dolobid Moblgesic Ticlid  Ascriptin with Codeine Doloprin or Doloprin with Codeine Momentum Tolectin  Asperbuf Duoprin Mono-gesic Trendar  Aspergum Duradyne Motrin or Motrin IB Triminicin   Aspirin plain, buffered or enteric coated Durasal Myochrisine Trigesic  Aspirin Suppositories Easprin Nalfon Trillsate  Aspirin with Codeine Ecotrin Regular or Extra Strength Naprosyn Uracel  Atromid-S Efficin Naproxen Ursinus  Auranofin Capsules Elmiron Neocylate Vanquish  Axotal Emagrin Norgesic Verin  Azathioprine Empirin or Empirin with Codeine Normiflo Vitamin E  Azolid Emprazil Nuprin Voltaren  Bayer Aspirin plain, buffered or children's or timed BC Tablets or powders Encaprin Orgaran Warfarin Sodium  Buff-a-Comp Enoxaparin Orudis Zorpin  Buff-a-Comp with Codeine Equegesic Os-Cal-Gesic   Buffaprin Excedrin plain, buffered or Extra Strength Oxalid   Bufferin Arthritis Strength Feldene Oxphenbutazone   Bufferin plain or Extra Strength Feldene Capsules Oxycodone with Aspirin   Bufferin with Codeine Fenoprofen Fenoprofen Pabalate or Pabalate-SF   Buffets II Flogesic Panagesic   Buffinol plain or Extra Strength Florinal or Florinal with Codeine Panwarfarin   Buf-Tabs Flurbiprofen Penicillamine   Butalbital Compound Four-way cold tablets Penicillin   Butazolidin Fragmin Pepto-Bismol   Carbenicillin Geminisyn Percodan   Carna Arthritis Reliever Geopen Persantine   Carprofen Gold's salt Persistin   Chloramphenicol Goody's Phenylbutazone   Chloromycetin Haltrain Piroxlcam   Clmetidine heparin Plaquenil   Cllnoril Hyco-pap Ponstel   Clofibrate Hydroxy chloroquine Propoxyphen  Before stopping any of these medications, be sure to consult the physician who ordered them.  Some, such as Coumadin (Warfarin) are ordered to prevent or treat serious conditions such as "deep thrombosis", "pumonary embolisms", and other heart problems.  The amount of time that you may need off of the medication may also vary with the medication and the reason for which you were taking it.  If you are taking any of these medications, please make sure you notify your pain physician before you undergo any  procedures.         Facet Blocks Patient Information  Description: The facets are joints in the spine between the vertebrae.  Like any joints in the body, facets can become irritated and painful.  Arthritis can also effect the facets.  By injecting steroids and local anesthetic in and around these joints, we can temporarily block the nerve supply to them.  Steroids act directly on irritated nerves and tissues to reduce selling and inflammation which often leads to decreased pain.  Facet blocks may be done anywhere along the spine from the neck to the low back depending upon the location of your pain.   After numbing the skin with local anesthetic (like Novocaine), a small needle is passed onto the facet joints under x-ray guidance.  You may experience a sensation of pressure while this is being done.  The entire block usually lasts about 15-25 minutes.   Conditions which may be treated by facet blocks:   Low back/buttock pain  Neck/shoulder pain  Certain types of headaches  Preparation for the injection:  1. Do not eat any solid food or dairy products within 8 hours of your appointment. 2. You may drink clear liquid up to 3 hours before appointment.  Clear liquids include water, black coffee, juice or soda.  No milk or cream please. 3. You may take your regular medication, including pain medications, with a sip of water before your appointment.  Diabetics should hold regular insulin (if taken separately) and take 1/2 normal NPH dose the morning of the procedure.  Carry some sugar containing items with you to your appointment. 4. A driver must accompany you and be prepared to drive you home after your procedure. 5. Bring all your current medications with you. 6. An IV may be inserted and sedation may be given at the discretion of the physician. 7. A blood pressure cuff, EKG and other monitors will often be applied during the procedure.  Some patients may need to have extra oxygen  administered for a short period. 8. You will be asked to provide medical information, including your allergies and medications, prior to the procedure.  We must know immediately if you are taking blood thinners (like Coumadin/Warfarin) or if you are allergic to IV iodine contrast (dye).  We must know if you could possible be pregnant.  Possible side-effects:   Bleeding from needle site  Infection (rare, may require surgery)  Nerve injury (rare)  Numbness & tingling (temporary)  Difficulty urinating (rare, temporary)  Spinal headache (a headache worse with upright posture)  Light-headedness (temporary)  Pain at injection site (serveral days)  Decreased blood pressure (rare, temporary)  Weakness in arm/leg (temporary)  Pressure sensation in back/neck (temporary)   Call if you experience:   Fever/chills associated with headache or increased back/neck pain  Headache worsened by an upright position  New onset, weakness or numbness of an extremity below the injection site  Hives or difficulty breathing (go to the emergency room)  Inflammation or drainage at the injection site(s)  Severe back/neck pain greater than usual  New symptoms which are concerning to you  Please note:  Although the local anesthetic injected can often make your back or neck feel good for several hours after the injection, the pain will likely return. It takes 3-7 days for steroids to work.  You may not notice any pain relief for at least one week.  If effective, we will often do a series of 2-3 injections spaced 3-6 weeks apart to maximally decrease your pain.  After the initial series, you may be a candidate for a more permanent nerve block of the facets.  If you have any questions, please call #[phone]) [facility]

## 2018-02-03 ENCOUNTER — Ambulatory Visit (HOSPITAL_BASED_OUTPATIENT_CLINIC_OR_DEPARTMENT_OTHER): Payer: Medicare Other | Admitting: Student in an Organized Health Care Education/Training Program

## 2018-02-03 ENCOUNTER — Encounter: Payer: Self-pay | Admitting: Student in an Organized Health Care Education/Training Program

## 2018-02-03 ENCOUNTER — Ambulatory Visit
Admission: RE | Admit: 2018-02-03 | Discharge: 2018-02-03 | Disposition: A | Discharge status: Home / Self Care | Payer: Medicare Other | Source: Ambulatory Visit | Attending: Student in an Organized Health Care Education/Training Program | Admitting: Student in an Organized Health Care Education/Training Program

## 2018-02-03 ENCOUNTER — Other Ambulatory Visit: Payer: Self-pay

## 2018-02-03 VITALS — BP 135/94 | HR 74 | Temp 98.0°F | Resp 16 | Ht 73.0 in | Wt 246.0 lb

## 2018-02-03 DIAGNOSIS — M47816 Spondylosis without myelopathy or radiculopathy, lumbar region: Secondary | ICD-10-CM | POA: Diagnosis present

## 2018-02-03 MED ORDER — DEXAMETHASONE SODIUM PHOSPHATE 10 MG/ML IJ SOLN
10.0000 mg | Freq: Once | INTRAMUSCULAR | Status: AC
  Administered 2018-02-03: 10 mg
  Filled 2018-02-03: qty 1

## 2018-02-03 MED ORDER — ROPIVACAINE HCL 2 MG/ML IJ SOLN
10.0000 mL | Freq: Once | INTRAMUSCULAR | Status: AC
  Administered 2018-02-03: 10 mL
  Filled 2018-02-03: qty 10

## 2018-02-03 MED ORDER — LIDOCAINE HCL 2 % IJ SOLN
20.0000 mL | Freq: Once | INTRAMUSCULAR | Status: AC
  Administered 2018-02-03: 400 mg
  Filled 2018-02-03: qty 40

## 2018-02-03 MED ORDER — LACTATED RINGERS IV SOLN
1000.0000 mL | Freq: Once | INTRAVENOUS | Status: AC
  Administered 2018-02-03: 1000 mL via INTRAVENOUS

## 2018-02-03 MED ORDER — FENTANYL CITRATE (PF) 100 MCG/2ML IJ SOLN
25.0000 ug | INTRAMUSCULAR | Status: DC | PRN
  Administered 2018-02-03: 50 ug via INTRAVENOUS
  Filled 2018-02-03: qty 2

## 2018-02-03 NOTE — Patient Instructions (Signed)

## 2018-02-03 NOTE — Progress Notes (Signed)
Patient's Name: Timothy Farley  MRN: [id number]  Referring Provider: Kirk Ruths, MD  DOB: 10/12/1975  PCP: Kirk Ruths, MD  DOS: 02/03/2018  Note by: Gillis Santa, MD  Service setting: Ambulatory outpatient  Specialty: Interventional Pain Management  Patient type: Established  Location: [facility] (AMB) Pain Management Facility  Visit type: Interventional Procedure   Primary Reason for Visit: Interventional Pain Management Treatment. CC: Back Pain (low and right) and Hip Pain (right)  Procedure:          Anesthesia, Analgesia, Anxiolysis:  Type: Lumbar Facet, Medial Branch Block(s) #2  Primary Purpose: Diagnostic Region: Posterolateral Lumbosacral Spine Level: L3, L4, L5, Medial Branch Level(s). Injecting these levels blocks the L3-4, L4-5, lumbar facet joints. Laterality: Bilateral  Type: Moderate (Conscious) Sedation combined with Local Anesthesia Indication(s): Analgesia and Anxiety Route: Intravenous (IV) IV Access: Secured Sedation: Meaningful verbal contact was maintained at all times during the procedure  Local Anesthetic: Lidocaine 1-2%  Position: Prone   Indications: 1. Lumbar facet arthropathy (L5/S1)   2. Facet arthritis, degenerative, lumbar spine    Pain Score: Pre-procedure: 5 /10 Post-procedure: 0-No pain/10  Pre-op Assessment:  Timothy Farley is a 42 y.o. (year old), male patient, seen today for interventional treatment. He  has no past surgical history on file. Timothy Farley has a current medication list which includes the following prescription(s): meclizine, fish oil, pantoprazole, and valsartan, and the following Facility-Administered Medications: fentanyl. His primarily concern today is the Back Pain (low and right) and Hip Pain (right)  Initial Vital Signs:  Pulse/HCG Rate: 74ECG Heart Rate: 66 Temp: 98.5 F (36.9 C) Resp: 18 BP: (!) 134/95 SpO2: 100 %  BMI: Estimated body mass index is 32.46 kg/m as calculated from the following:   Height as of  this encounter: 6\' 1"  (1.854 m).   Weight as of this encounter: 246 lb (111.6 kg).  Risk Assessment: Allergies: Reviewed. He is allergic to penicillins.  Allergy Precautions: None required Coagulopathies: Reviewed. None identified.  Blood-thinner therapy: None at this time Active Infection(s): Reviewed. None identified. Timothy Farley is afebrile  Site Confirmation: Timothy Farley was asked to confirm the procedure and laterality before marking the site Procedure checklist: Completed Consent: Before the procedure and under the influence of no sedative(s), amnesic(s), or anxiolytics, the patient was informed of the treatment options, risks and possible complications. To fulfill our ethical and legal obligations, as recommended by the American Medical Association's Code of Ethics, I have informed the patient of my clinical impression; the nature and purpose of the treatment or procedure; the risks, benefits, and possible complications of the intervention; the alternatives, including doing nothing; the risk(s) and benefit(s) of the alternative treatment(s) or procedure(s); and the risk(s) and benefit(s) of doing nothing. The patient was provided information about the general risks and possible complications associated with the procedure. These may include, but are not limited to: failure to achieve desired goals, infection, bleeding, organ or nerve damage, allergic reactions, paralysis, and death. In addition, the patient was informed of those risks and complications associated to Spine-related procedures, such as failure to decrease pain; infection (i.e.: Meningitis, epidural or intraspinal abscess); bleeding (i.e.: epidural hematoma, subarachnoid hemorrhage, or any other type of intraspinal or peri-dural bleeding); organ or nerve damage (i.e.: Any type of peripheral nerve, nerve root, or spinal cord injury) with subsequent damage to sensory, motor, and/or autonomic systems, resulting in permanent pain,  numbness, and/or weakness of one or several areas of the body; allergic reactions; (i.e.: anaphylactic reaction); and/or death.  Furthermore, the patient was informed of those risks and complications associated with the medications. These include, but are not limited to: allergic reactions (i.e.: anaphylactic or anaphylactoid reaction(s)); adrenal axis suppression; blood sugar elevation that in diabetics may result in ketoacidosis or comma; water retention that in patients with history of congestive heart failure may result in shortness of breath, pulmonary edema, and decompensation with resultant heart failure; weight gain; swelling or edema; medication-induced neural toxicity; particulate matter embolism and blood vessel occlusion with resultant organ, and/or nervous system infarction; and/or aseptic necrosis of one or more joints. Finally, the patient was informed that Medicine is not an exact science; therefore, there is also the possibility of unforeseen or unpredictable risks and/or possible complications that may result in a catastrophic outcome. The patient indicated having understood very clearly. We have given the patient no guarantees and we have made no promises. Enough time was given to the patient to ask questions, all of which were answered to the patient's satisfaction. Timothy Farley has indicated that he wanted to continue with the procedure. Attestation: I, the ordering provider, attest that I have discussed with the patient the benefits, risks, side-effects, alternatives, likelihood of achieving goals, and potential problems during recovery for the procedure that I have provided informed consent. Date  Time: 02/03/2018 12:01 PM  Pre-Procedure Preparation:  Monitoring: As per clinic protocol. Respiration, ETCO2, SpO2, BP, heart rate and rhythm monitor placed and checked for adequate function Safety Precautions: Patient was assessed for positional comfort and pressure points before starting  the procedure. Time-out: I initiated and conducted the "Time-out" before starting the procedure, as per protocol. The patient was asked to participate by confirming the accuracy of the "Time Out" information. Verification of the correct person, site, and procedure were performed and confirmed by me, the nursing staff, and the patient. "Time-out" conducted as per Joint Commission's Universal Protocol (UP.01.01.01). Time: 1239  Description of Procedure:          Laterality: Bilateral. The procedure was performed in identical fashion on both sides. Levels:   L3, L4, L5,  Medial Branch Level(s) Area Prepped: Posterior Lumbosacral Region Prepping solution: ChloraPrep (2% chlorhexidine gluconate and 70% isopropyl alcohol) Safety Precautions: Aspiration looking for blood return was conducted prior to all injections. At no point did we inject any substances, as a needle was being advanced. Before injecting, the patient was told to immediately notify me if he was experiencing any new onset of "ringing in the ears, or metallic taste in the mouth". No attempts were made at seeking any paresthesias. Safe injection practices and needle disposal techniques used. Medications properly checked for expiration dates. SDV (single dose vial) medications used. After the completion of the procedure, all disposable equipment used was discarded in the proper designated medical waste containers. Local Anesthesia: Protocol guidelines were followed. The patient was positioned over the fluoroscopy table. The area was prepped in the usual manner. The time-out was completed. The target area was identified using fluoroscopy. A 12-in long, straight, sterile hemostat was used with fluoroscopic guidance to locate the targets for each level blocked. Once located, the skin was marked with an approved surgical skin marker. Once all sites were marked, the skin (epidermis, dermis, and hypodermis), as well as deeper tissues (fat, connective  tissue and muscle) were infiltrated with a small amount of a short-acting local anesthetic, loaded on a 10cc syringe with a 25G, 1.5-in  Needle. An appropriate amount of time was allowed for local anesthetics to take effect before proceeding to  the next step. Local Anesthetic: Lidocaine 2.0% The unused portion of the local anesthetic was discarded in the proper designated containers. Technical explanation of process:   L3 Medial Branch Nerve Block (MBB): The target area for the L3 medial branch is at the junction of the postero-lateral aspect of the superior articular process and the superior, posterior, and medial edge of the transverse process of L4. Under fluoroscopic guidance, a Quincke needle was inserted until contact was made with os over the superior postero-lateral aspect of the pedicular shadow (target area). After negative aspiration for blood, 1 mL of the nerve block solution was injected without difficulty or complication. The needle was removed intact. L4 Medial Branch Nerve Block (MBB): The target area for the L4 medial branch is at the junction of the postero-lateral aspect of the superior articular process and the superior, posterior, and medial edge of the transverse process of L5. Under fluoroscopic guidance, a Quincke needle was inserted until contact was made with os over the superior postero-lateral aspect of the pedicular shadow (target area). After negative aspiration for blood, 62mL of the nerve block solution was injected without difficulty or complication. The needle was removed intact. L5 Medial Branch Nerve Block (MBB): The target area for the L5 medial branch is at the junction of the postero-lateral aspect of the superior articular process and the superior, posterior, and medial edge of the sacral ala. Under fluoroscopic guidance, a Quincke needle was inserted until contact was made with os over the superior postero-lateral aspect of the pedicular shadow (target area). After  negative aspiration for blood, 76mL of the nerve block solution was injected without difficulty or complication. The needle was removed intact.  Procedural Needles: 22-gauge, 3.5-inch, Quincke needles used for all levels. Nerve block solution: 8 CC solution made of 7 cc of 0.2% ropivacaine, 1 cc of Decadron 10 mg/cc.  1 cc injected at each level above bilaterally. The unused portion of the solution was discarded in the proper designated containers.  Once the entire procedure was completed, the treated area was cleaned, making sure to leave some of the prepping solution back to take advantage of its long term bactericidal properties.   Illustration of the posterior view of the lumbar spine and the posterior neural structures. Laminae of L2 through S1 are labeled. DPRL5, dorsal primary ramus of L5; DPRS1, dorsal primary ramus of S1; DPR3, dorsal primary ramus of L3; FJ, facet (zygapophyseal) joint L3-L4; I, inferior articular process of L4; LB1, lateral branch of dorsal primary ramus of L1; IAB, inferior articular branches from L3 medial branch (supplies L4-L5 facet joint); IBP, intermediate branch plexus; MB3, medial branch of dorsal primary ramus of L3; NR3, third lumbar nerve root; S, superior articular process of L5; SAB, superior articular branches from L4 (supplies L4-5 facet joint also); TP3, transverse process of L3.  Vitals:   02/03/18 1304 02/03/18 1313 02/03/18 1323 02/03/18 1333  BP: (!) 91/53 (!) 118/95 (!) 140/93 (!) 135/94  Pulse:      Resp: 15 15 19 16   Temp:  98.5 F (36.9 C)  98 F (36.7 C)  TempSrc:  Temporal  Temporal  SpO2: 97% 98% 100% 99%  Weight:      Height:         Start Time: 1239 hrs. End Time: 1304 hrs.  Imaging Guidance (Spinal):          Type of Imaging Technique: Fluoroscopy Guidance (Spinal) Indication(s): Assistance in needle guidance and placement for procedures requiring needle placement in or  near specific anatomical locations not easily accessible  without such assistance. Exposure Time: Please see nurses notes. Contrast: None used. Fluoroscopic Guidance: I was personally present during the use of fluoroscopy. "Tunnel Vision Technique" used to obtain the best possible view of the target area. Parallax error corrected before commencing the procedure. "Direction-depth-direction" technique used to introduce the needle under continuous pulsed fluoroscopy. Once target was reached, antero-posterior, oblique, and lateral fluoroscopic projection used confirm needle placement in all planes. Images permanently stored in EMR. Interpretation: No contrast injected. I personally interpreted the imaging intraoperatively. Adequate needle placement confirmed in multiple planes. Permanent images saved into the patient's record.  Antibiotic Prophylaxis:   Anti-infectives (From admission, onward)   None     Indication(s): None identified  Post-operative Assessment:  Post-procedure Vital Signs:  Pulse/HCG Rate: 7478 Temp: 98 F (36.7 C) Resp: 16 BP: (!) 135/94 SpO2: 99 %  EBL: None  Complications: No immediate post-treatment complications observed by team, or reported by patient.  Note: The patient tolerated the entire procedure well. A repeat set of vitals were taken after the procedure and the patient was kept under observation following institutional policy, for this type of procedure. Post-procedural neurological assessment was performed, showing return to baseline, prior to discharge. The patient was provided with post-procedure discharge instructions, including a section on how to identify potential problems. Should any problems arise concerning this procedure, the patient was given instructions to immediately contact us, at any time, without hesitation. In any case, we plan to contact the patient by telephone for a follow-up status report regarding this interventional procedure.  Comments:  No additional relevant information. 5 out of 5  strength bilateral lower extremity: Plantar flexion, dorsiflexion, knee flexion, knee extension.  Plan of Care    Imaging Orders     DG C-Arm 1-60 Min-No Report Procedure Orders    No procedure(s) ordered today    Medications ordered for procedure: Meds ordered this encounter  Medications  . lactated ringers infusion 1,000 mL  . fentaNYL (SUBLIMAZE) injection 25-100 mcg    Make sure Narcan is available in the pyxis when using this medication. In the event of respiratory depression (RR< 8/min): Titrate NARCAN (naloxone) in increments of 0.1 to 0.2 mg IV at 2-3 minute intervals, until desired degree of reversal.  . lidocaine (XYLOCAINE) 2 % (with pres) injection 400 mg  . ropivacaine (PF) 2 mg/mL (0.2%) (NAROPIN) injection 10 mL  . dexamethasone (DECADRON) injection 10 mg  . dexamethasone (DECADRON) injection 10 mg   Medications administered: We administered lactated ringers, fentaNYL, lidocaine, ropivacaine (PF) 2 mg/mL (0.2%), dexamethasone, and dexamethasone.  See the medical record for exact dosing, route, and time of administration.  Disposition: Discharge home  Discharge Date & Time: 02/03/2018; 1333 hrs.   Physician-requested Follow-up: Return in about 23 days (around 02/26/2018) for Post Procedure Evaluation.  Future Appointments  Date Time Provider [place]  02/24/2018  1:45 PM Gillis Santa, MD [facility] None   Primary Care Physician: Kirk Ruths, MD Location: [facility] Facility Note by: Gillis Santa, MD Date: 02/03/2018; Time: 2:31 PM  Disclaimer:  Medicine is not an exact science. The only guarantee in medicine is that nothing is guaranteed. It is important to note that the decision to proceed with this intervention was based on the information collected from the patient. The Data and conclusions were drawn from the patient's questionnaire, the interview, and the physical examination. Because the information was provided in  large part by the patient, it  cannot be guaranteed that it has not been purposely or unconsciously manipulated. Every effort has been made to obtain as much relevant data as possible for this evaluation. It is important to note that the conclusions that lead to this procedure are derived in large part from the available data. Always take into account that the treatment will also be dependent on availability of resources and existing treatment guidelines, considered by other Pain Management Practitioners as being common knowledge and practice, at the time of the intervention. For Medico-Legal purposes, it is also important to point out that variation in procedural techniques and pharmacological choices are the acceptable norm. The indications, contraindications, technique, and results of the above procedure should only be interpreted and judged by a Board-Certified Interventional Pain Specialist with extensive familiarity and expertise in the same exact procedure and technique.

## 2018-02-03 NOTE — Progress Notes (Signed)
Safety precautions to be maintained throughout the outpatient stay will include: orient to surroundings, keep bed in low position, maintain call bell within reach at all times, provide assistance with transfer out of bed and ambulation.  

## 2018-02-04 ENCOUNTER — Telehealth: Payer: Self-pay

## 2018-02-04 NOTE — Telephone Encounter (Signed)
Spoke with pts wife. States that Mr Derks was unavailable at this time. States he is doing well. Instructed to call if needed.

## 2018-02-18 ENCOUNTER — Encounter: Payer: Self-pay | Admitting: *Deleted

## 2018-02-19 ENCOUNTER — Ambulatory Visit
Admission: RE | Admit: 2018-02-19 | Discharge: 2018-02-19 | Disposition: A | Discharge status: Home / Self Care | Payer: Medicare Other | Source: Ambulatory Visit | Attending: Internal Medicine | Admitting: Internal Medicine

## 2018-02-19 ENCOUNTER — Ambulatory Visit: Payer: Medicare Other | Admitting: Certified Registered"

## 2018-02-19 ENCOUNTER — Encounter
Admission: RE | Disposition: A | Discharge status: Home / Self Care | Payer: Self-pay | Source: Ambulatory Visit | Attending: Internal Medicine

## 2018-02-19 ENCOUNTER — Encounter: Payer: Self-pay | Admitting: *Deleted

## 2018-02-19 DIAGNOSIS — Z880 Allergy status to penicillin: Secondary | ICD-10-CM | POA: Insufficient documentation

## 2018-02-19 DIAGNOSIS — R1033 Periumbilical pain: Secondary | ICD-10-CM | POA: Diagnosis present

## 2018-02-19 DIAGNOSIS — K219 Gastro-esophageal reflux disease without esophagitis: Secondary | ICD-10-CM | POA: Insufficient documentation

## 2018-02-19 DIAGNOSIS — K2950 Unspecified chronic gastritis without bleeding: Secondary | ICD-10-CM | POA: Insufficient documentation

## 2018-02-19 DIAGNOSIS — K317 Polyp of stomach and duodenum: Secondary | ICD-10-CM | POA: Insufficient documentation

## 2018-02-19 DIAGNOSIS — Z79899 Other long term (current) drug therapy: Secondary | ICD-10-CM | POA: Insufficient documentation

## 2018-02-19 DIAGNOSIS — M1990 Unspecified osteoarthritis, unspecified site: Secondary | ICD-10-CM | POA: Insufficient documentation

## 2018-02-19 DIAGNOSIS — K449 Diaphragmatic hernia without obstruction or gangrene: Secondary | ICD-10-CM | POA: Insufficient documentation

## 2018-02-19 HISTORY — DX: Unspecified osteoarthritis, unspecified site: M19.90

## 2018-02-19 HISTORY — PX: ESOPHAGOGASTRODUODENOSCOPY (EGD) WITH PROPOFOL: SHX5813

## 2018-02-19 HISTORY — DX: Gastro-esophageal reflux disease without esophagitis: K21.9

## 2018-02-19 HISTORY — DX: Foot drop, left foot: M21.372

## 2018-02-19 HISTORY — DX: Other amnesia: R41.3

## 2018-02-19 HISTORY — DX: Traumatic subdural hemorrhage with loss of consciousness of unspecified duration, initial encounter: S06.5X9A

## 2018-02-19 HISTORY — DX: Traumatic subdural hemorrhage with loss of consciousness status unknown, initial encounter: S06.5XAA

## 2018-02-19 HISTORY — DX: Pleurisy: R09.1

## 2018-02-19 SURGERY — ESOPHAGOGASTRODUODENOSCOPY (EGD) WITH PROPOFOL
Anesthesia: General

## 2018-02-19 MED ORDER — EPHEDRINE SULFATE 50 MG/ML IJ SOLN
INTRAMUSCULAR | Status: AC
  Filled 2018-02-19: qty 1

## 2018-02-19 MED ORDER — PROPOFOL 10 MG/ML IV BOLUS
INTRAVENOUS | Status: DC | PRN
  Administered 2018-02-19: 50 mg via INTRAVENOUS
  Administered 2018-02-19: 40 mg via INTRAVENOUS
  Administered 2018-02-19: 20 mg via INTRAVENOUS
  Administered 2018-02-19: 40 mg via INTRAVENOUS
  Administered 2018-02-19: 50 mg via INTRAVENOUS

## 2018-02-19 MED ORDER — PROPOFOL 10 MG/ML IV BOLUS
INTRAVENOUS | Status: AC
  Filled 2018-02-19: qty 40

## 2018-02-19 MED ORDER — GLYCOPYRROLATE 0.2 MG/ML IJ SOLN
INTRAMUSCULAR | Status: DC | PRN
  Administered 2018-02-19: 0.2 mg via INTRAVENOUS

## 2018-02-19 MED ORDER — PHENYLEPHRINE HCL 10 MG/ML IJ SOLN
INTRAMUSCULAR | Status: DC | PRN
  Administered 2018-02-19: 50 ug via INTRAVENOUS
  Administered 2018-02-19 (×3): 100 ug via INTRAVENOUS

## 2018-02-19 MED ORDER — LIDOCAINE HCL (CARDIAC) PF 100 MG/5ML IV SOSY
PREFILLED_SYRINGE | INTRAVENOUS | Status: DC | PRN
  Administered 2018-02-19: 50 mg via INTRATRACHEAL

## 2018-02-19 MED ORDER — EPHEDRINE SULFATE 50 MG/ML IJ SOLN
INTRAMUSCULAR | Status: DC | PRN
  Administered 2018-02-19: 10 mg via INTRAVENOUS

## 2018-02-19 MED ORDER — SODIUM CHLORIDE 0.9 % IV SOLN
INTRAVENOUS | Status: DC
  Administered 2018-02-19: 1000 mL via INTRAVENOUS

## 2018-02-19 MED ORDER — PROPOFOL 500 MG/50ML IV EMUL
INTRAVENOUS | Status: AC
  Filled 2018-02-19: qty 50

## 2018-02-19 MED ORDER — PROPOFOL 500 MG/50ML IV EMUL
INTRAVENOUS | Status: DC | PRN
  Administered 2018-02-19: 100 ug/kg/min via INTRAVENOUS

## 2018-02-19 MED ORDER — GLYCOPYRROLATE 0.2 MG/ML IJ SOLN
INTRAMUSCULAR | Status: AC
  Filled 2018-02-19: qty 1

## 2018-02-19 NOTE — Op Note (Signed)
[facility] Patient Name: Timothy Farley Procedure Date: 02/19/2018 1:29 PM MRN: [id number] Account #: [id number] Date of Birth: [birth year] Admit Type: Outpatient Age: 42 Room: [facility] ENDO ROOM 3 Gender: Male Note Status: Finalized Procedure:            Upper GI endoscopy Indications:          Periumbilical abdominal pain, Gastro-esophageal reflux                        disease, Failure to respond to medical treatment Providers:            Benay Pike. [name] MD, MD Referring MD:         Ocie Cornfield. Ouida Sills MD, MD (Referring MD) Medicines:            Propofol per Anesthesia Complications:        No immediate complications. Procedure:            Pre-Anesthesia Assessment:                       - The risks and benefits of the procedure and the                        sedation options and risks were discussed with the                        patient. All questions were answered and informed                        consent was obtained.                       - Patient identification and proposed procedure were                        verified prior to the procedure by the nurse. The                        procedure was verified in the procedure room.                       - ASA Grade Assessment: III - A patient with severe                        systemic disease.                       - After reviewing the risks and benefits, the patient                        was deemed in satisfactory condition to undergo the                        procedure.                       After obtaining informed consent, the endoscope was                        passed under direct vision. Throughout the procedure,  the patient's blood pressure, pulse, and oxygen                        saturations were monitored continuously. The Endoscope                        was introduced through the mouth, and advanced to the                        third part of  duodenum. The upper GI endoscopy was                        accomplished without difficulty. The patient tolerated                        the procedure well. Findings:      The examined esophagus was normal.      A large hiatal hernia was found. The proximal extent of the gastric       folds (end of tubular esophagus) was in the cardia (on retroflexion).      Diffuse moderate inflammation characterized by congestion (edema),       erosions, erythema and friability was found in the entire examined       stomach. Biopsies were taken with a cold forceps for Helicobacter pylori       testing.      Multiple 3 to 12 mm pedunculated and sessile polyps with no bleeding and       stigmata of recent bleeding were found in the gastric body. Biopsies       were taken with a cold forceps for histology.      The examined duodenum was normal.      The exam was otherwise without abnormality. Impression:           - Normal esophagus.                       - Large hiatal hernia.                       - Chronic gastritis. Biopsied.                       - Multiple gastric polyps. Biopsied.                       - Normal examined duodenum.                       - The examination was otherwise normal. Recommendation:       - Patient has a contact number available for                        emergencies. The signs and symptoms of potential                        delayed complications were discussed with the patient.                        Return to normal activities tomorrow. Written discharge                        instructions were  provided to the patient.                       - Resume previous diet.                       - Continue present medications.                       - Await pathology results.                       - Obtain UGI series to evaluate abdominal pain and GERD                        symptoms.                       - Return to physician assistant in 1 month.                       -  Await pathology results.                       - The findings and recommendations were discussed with                        the patient and their family. Procedure Code(s):    --- Professional ---                       [phone], Esophagogastroduodenoscopy, flexible, transoral;                        with biopsy, single or multiple Diagnosis Code(s):    --- Professional ---                       K21.9, Gastro-esophageal reflux disease without                        esophagitis                       H85.27, Periumbilical pain                       K31.7, Polyp of stomach and duodenum                       K29.50, Unspecified chronic gastritis without bleeding                       K44.9, Diaphragmatic hernia without obstruction or                        gangrene CPT copyright 2018 American Medical Association. All rights reserved. The codes documented in this report are preliminary and upon coder review may  be revised to meet current compliance requirements. Efrain Sella MD, MD 02/19/2018 2:14:47 PM This report has been signed electronically. Number of Addenda: 0 Note Initiated On: 02/19/2018 1:29 PM      [facility]

## 2018-02-19 NOTE — Anesthesia Post-op Follow-up Note (Signed)
Anesthesia QCDR form completed.        
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
## 2018-02-19 NOTE — Transfer of Care (Signed)
Immediate Anesthesia Transfer of Care Note  Patient: Timothy Farley  Procedure(s) Performed: ESOPHAGOGASTRODUODENOSCOPY (EGD) WITH PROPOFOL (N/A )  Patient Location: Endoscopy Unit  Anesthesia Type:General  Level of Consciousness: drowsy and patient cooperative  Airway & Oxygen Therapy: Patient Spontanous Breathing and Patient connected to nasal cannula oxygen  Post-op Assessment: Report given to RN and Post -op Vital signs reviewed and stable  Post vital signs: stable  Last Vitals:  Vitals Value Taken Time  BP 97/57 02/19/2018  2:08 PM  Temp 36.1 C 02/19/2018  2:08 PM  Pulse 85 02/19/2018  2:13 PM  Resp 20 02/19/2018  2:13 PM  SpO2 99 % 02/19/2018  2:13 PM  Vitals shown include unvalidated device data.  Last Pain:  Vitals:   02/19/18 1408  TempSrc: Tympanic  PainSc: 0-No pain      Patients Stated Pain Goal: 0 (29/19/16 6060)  Complications: No apparent anesthesia complications

## 2018-02-19 NOTE — H&P (Signed)
Outpatient short stay form Pre-procedure 02/19/2018 11:52 AM Timothy Farley K. [name], M.D.  Primary Physician: Frazier Richards, M.D.  Reason for visit:  Severe GERD  History of present illness:  Patient with severe GERD intermittently despite daily PPI. No bleeding, abdominal pain or weight loss or dysphagia.    No current facility-administered medications for this encounter.   Current Outpatient Medications:  .  meclizine (ANTIVERT) 12.5 MG tablet, Take 12.5 mg by mouth 3 (three) times daily as needed for dizziness., Disp: , Rfl:  .  Omega-3 Fatty Acids (FISH OIL) 1000 MG CAPS, Take by mouth., Disp: , Rfl:  .  pantoprazole (PROTONIX) 40 MG tablet, Take 40 mg by mouth daily., Disp: , Rfl:  .  VALSARTAN PO, Take by mouth daily., Disp: , Rfl:   No medications prior to admission.     Allergies  Allergen Reactions  . Penicillins      Past Medical History:  Diagnosis Date  . Arthritis   . GERD (gastroesophageal reflux disease)   . Left foot drop   . Pleurisy   . Short-term memory loss   . Subdural hematoma ([place])   . TBI (traumatic brain injury) ([place]) 1994    Review of systems:  Otherwise negative.    Physical Exam  Gen: Alert, oriented. Appears stated age.  HEENT: [place]/AT. PERRLA. Lungs: CTA, no wheezes. CV: RR nl S1, S2. Abd: soft, benign, no masses. BS+ Ext: No edema. Pulses 2+    Planned procedures: Proceed with EGD. The patient understands the nature of the planned procedure, indications, risks, alternatives and potential complications including but not limited to bleeding, infection, perforation, damage to internal organs and possible oversedation/side effects from anesthesia. The patient agrees and gives consent to proceed.  Please refer to procedure notes for findings, recommendations and patient disposition/instructions.     Timothy Farley K. [name], M.D. Gastroenterology 02/19/2018  11:52 AM

## 2018-02-20 ENCOUNTER — Encounter: Payer: Self-pay | Admitting: Internal Medicine

## 2018-02-20 ENCOUNTER — Other Ambulatory Visit: Payer: Self-pay | Admitting: Gastroenterology

## 2018-02-20 DIAGNOSIS — K219 Gastro-esophageal reflux disease without esophagitis: Secondary | ICD-10-CM

## 2018-02-20 DIAGNOSIS — K449 Diaphragmatic hernia without obstruction or gangrene: Principal | ICD-10-CM

## 2018-02-20 NOTE — Anesthesia Postprocedure Evaluation (Signed)
Anesthesia Post Note  Patient: Timothy Farley  Procedure(s) Performed: ESOPHAGOGASTRODUODENOSCOPY (EGD) WITH PROPOFOL (N/A )  Patient location during evaluation: Endoscopy Anesthesia Type: General Level of consciousness: awake and alert and oriented Pain management: pain level controlled Vital Signs Assessment: post-procedure vital signs reviewed and stable Respiratory status: spontaneous breathing, nonlabored ventilation and respiratory function stable Cardiovascular status: blood pressure returned to baseline and stable Postop Assessment: no signs of nausea or vomiting Anesthetic complications: no     Last Vitals:  Vitals:   02/19/18 1428 02/19/18 1438  BP: 106/70 (!) 96/57  Pulse: 64 (!) 55  Resp: 17 16  Temp:    SpO2: 97% 98%    Last Pain:  Vitals:   02/20/18 0724  TempSrc:   PainSc: 0-No pain                 Oracio Galen

## 2018-02-24 ENCOUNTER — Other Ambulatory Visit: Payer: Self-pay

## 2018-02-24 ENCOUNTER — Ambulatory Visit (HOSPITAL_BASED_OUTPATIENT_CLINIC_OR_DEPARTMENT_OTHER): Payer: Medicare Other | Admitting: Student in an Organized Health Care Education/Training Program

## 2018-02-24 ENCOUNTER — Encounter: Payer: Self-pay | Admitting: Student in an Organized Health Care Education/Training Program

## 2018-02-24 VITALS — BP 123/75 | HR 89 | Temp 98.4°F | Resp 18 | Ht 73.0 in | Wt 246.0 lb

## 2018-02-24 DIAGNOSIS — K449 Diaphragmatic hernia without obstruction or gangrene: Secondary | ICD-10-CM | POA: Diagnosis present

## 2018-02-24 DIAGNOSIS — K219 Gastro-esophageal reflux disease without esophagitis: Secondary | ICD-10-CM | POA: Diagnosis present

## 2018-02-24 DIAGNOSIS — M47816 Spondylosis without myelopathy or radiculopathy, lumbar region: Secondary | ICD-10-CM

## 2018-02-24 DIAGNOSIS — G894 Chronic pain syndrome: Secondary | ICD-10-CM

## 2018-02-24 LAB — SURGICAL PATHOLOGY

## 2018-02-24 NOTE — Progress Notes (Signed)
Patient's Name: Timothy Farley  MRN: [id number]  Referring Provider: Kirk Ruths, MD  DOB: Oct 12, 1975  PCP: Kirk Ruths, MD  DOS: 02/24/2018  Note by: Gillis Santa, MD  Service setting: Ambulatory outpatient  Specialty: Interventional Pain Management  Location: [facility] (AMB) Pain Management Facility    Patient type: Established   Primary Reason(s) for Visit: Encounter for post-procedure evaluation of chronic illness with mild to moderate exacerbation CC: Back Pain (lower)  HPI  Timothy Farley is a 42 y.o. year old, male patient, who comes today for a post-procedure evaluation. He has Ataxia; Cognitive deficit as late effect of traumatic brain injury ([place]); History of traumatic brain injury; Osteoarthritis of right knee; Lumbar facet arthropathy; and Chronic pain syndrome on their problem list. His primarily concern today is the Back Pain (lower)  Pain Assessment: Location: Lower Back Radiating: denies Onset: More than a month ago Duration: Chronic pain Quality: Aching Severity: 2 /10 (subjective, self-reported pain score)  Note: Reported level is compatible with observation.                         When using our objective Pain Scale, levels between 6 and 10/10 are said to belong in an emergency room, as it progressively worsens from a 6/10, described as severely limiting, requiring emergency care not usually available at an outpatient pain management facility. At a 6/10 level, communication becomes difficult and requires great effort. Assistance to reach the emergency department may be required. Facial flushing and profuse sweating along with potentially dangerous increases in heart rate and blood pressure will be evident. Effect on ADL: hurts to bend over Timing: Intermittent Modifying factors: procedures BP: 123/75  HR: 89  Timothy Farley comes in today for post-procedure evaluation.  Further details on both, my assessment(s), as well as the proposed treatment plan, please see  below.  Post-Procedure Assessment  02/03/2018 Procedure: Bilateral L3, L4, L5 facet medial branch nerve block #2 Pre-procedure pain score:  5/10 Post-procedure pain score: 0/10         Influential Factors: BMI: 32.46 kg/m Intra-procedural challenges: None observed.         Assessment challenges: None detected.              Reported side-effects: None.        Post-procedural adverse reactions or complications: None reported         Sedation: Please see nurses note. When no sedatives are used, the analgesic levels obtained are directly associated to the effectiveness of the local anesthetics. However, when sedation is provided, the level of analgesia obtained during the initial 1 hour following the intervention, is believed to be the result of a combination of factors. These factors may include, but are not limited to: 1. The effectiveness of the local anesthetics used. 2. The effects of the analgesic(s) and/or anxiolytic(s) used. 3. The degree of discomfort experienced by the patient at the time of the procedure. 4. The patients ability and reliability in recalling and recording the events. 5. The presence and influence of possible secondary gains and/or psychosocial factors. Reported result: Relief experienced during the 1st hour after the procedure: 100 % (Ultra-Short Term Relief)            Interpretative annotation: Clinically appropriate result. Analgesia during this period is likely to be Local Anesthetic and/or IV Sedative (Analgesic/Anxiolytic) related.          Effects of local anesthetic: The analgesic effects attained during this period are  directly associated to the localized infiltration of local anesthetics and therefore cary significant diagnostic value as to the etiological location, or anatomical origin, of the pain. Expected duration of relief is directly dependent on the pharmacodynamics of the local anesthetic used. Long-acting (4-6 hours) anesthetics used.  Reported  result: Relief during the next 4 to 6 hour after the procedure: 100 % (Short-Term Relief)            Interpretative annotation: Clinically appropriate result. Analgesia during this period is likely to be Local Anesthetic-related.          Long-term benefit: Defined as the period of time past the expected duration of local anesthetics (1 hour for short-acting and 4-6 hours for long-acting). With the possible exception of prolonged sympathetic blockade from the local anesthetics, benefits during this period are typically attributed to, or associated with, other factors such as analgesic sensory neuropraxia, antiinflammatory effects, or beneficial biochemical changes provided by agents other than the local anesthetics.  Reported result: Extended relief following procedure: 100 %(pain is not as intense as it was before procedure; "the procedures are totally helping me") (Long-Term Relief)            Interpretative annotation: Clinically possible results. Good relief. No permanent benefit expected. Inflammation plays a part in the etiology to the pain.          Current benefits: Defined as reported results that persistent at this point in time.   Analgesia: 75 %            Function: Somewhat improved ROM: Somewhat improved Interpretative annotation: Ongoing benefit. No permanent benefit expected. Effective diagnostic intervention.          Interpretation: Results would suggest a successful diagnostic and therapeutic intervention.                  Plan:  Set up procedure as a PRN palliative treatment option for this patient.                Laboratory Chemistry  Inflammation Markers (CRP: Acute Phase) (ESR: Chronic Phase) No results found for: CRP, ESRSEDRATE, LATICACIDVEN                       Rheumatology Markers No results found for: RF, ANA, LABURIC, URICUR, LYMEIGGIGMAB, LYMEABIGMQN, HLAB27                      Renal Function Markers No results found for: BUN, CREATININE, BCR, GFRAA,  GFRNONAA, LABVMA, EPIRU, [id number], NOREPRU, NOREPI24HUR, DOPARU, [id number]                           Hepatic Function Markers No results found for: AST, ALT, ALBUMIN, ALKPHOS, HCVAB, AMYLASE, LIPASE, AMMONIA                      Electrolytes No results found for: NA, K, CL, CALCIUM, MG, PHOS                      Neuropathy Markers No results found for: VITAMINB12, FOLATE, HGBA1C, HIV                      CNS Tests No results found for: COLORCSF, APPEARCSF, RBCCOUNTCSF, WBCCSF, POLYSCSF, LYMPHSCSF, EOSCSF, PROTEINCSF, GLUCCSF, JCVIRUS, CSFOLI, IGGCSF  Bone Pathology Markers No results found for: VD25OH, [id number], [id number], [id number], 25OHVITD1, 25OHVITD2, 25OHVITD3, TESTOFREE, TESTOSTERONE                       Coagulation Parameters No results found for: INR, LABPROT, APTT, PLT, DDIMER, LABHEMA, VITAMINK1                      Cardiovascular Markers No results found for: BNP, CKTOTAL, CKMB, TROPONINI, HGB, HCT                       CA Markers No results found for: CEA, CA125, LABCA2                      Note: Lab results reviewed.  Recent Diagnostic Imaging Results  DG C-Arm 1-60 Min-No Report Fluoroscopy was utilized by the requesting physician.  No radiographic  interpretation.   Complexity Note: Imaging results reviewed. Results shared with Mr. Berendt, using Layman's terms.                         Meds   Current Outpatient Medications:  .  Omega-3 Fatty Acids (FISH OIL) 1000 MG CAPS, Take by mouth., Disp: , Rfl:  .  pantoprazole (PROTONIX) 40 MG tablet, Take 40 mg by mouth daily., Disp: , Rfl:  .  meclizine (ANTIVERT) 12.5 MG tablet, Take 12.5 mg by mouth 3 (three) times daily as needed for dizziness., Disp: , Rfl:  .  VALSARTAN PO, Take by mouth daily., Disp: , Rfl:   ROS  Constitutional: Denies any fever or chills Gastrointestinal: No reported hemesis, hematochezia, vomiting, or acute GI distress Musculoskeletal: Denies any acute onset  joint swelling, redness, loss of ROM, or weakness Neurological: No reported episodes of acute onset apraxia, aphasia, dysarthria, agnosia, amnesia, paralysis, loss of coordination, or loss of consciousness  Allergies  Mr. Hoefle is allergic to penicillins.  [place]  Drug: Mr. Zapf  reports no history of drug use. Alcohol:  reports no history of alcohol use. Tobacco:  reports that he has never smoked. He has never used smokeless tobacco. Medical:  has a past medical history of Arthritis, GERD (gastroesophageal reflux disease), Left foot drop, Pleurisy, Short-term memory loss, Subdural hematoma ([place]), and TBI (traumatic brain injury) ([place]) (1994). Surgical: Mr. Babington  has a past surgical history that includes brain surgery x3; trachostemy; Brain surgery; Gastrostomy w/ feeding tube; and Esophagogastroduodenoscopy (egd) with propofol (N/A, 02/19/2018). Family: family history is not on file.  Constitutional Exam  General appearance: Well nourished, well developed, and well hydrated. In no apparent acute distress Vitals:   02/24/18 1342  BP: 123/75  Pulse: 89  Resp: 18  Temp: 98.4 F (36.9 C)  TempSrc: Oral  SpO2: 97%  Weight: 246 lb (111.6 kg)  Height: _0  (1.854 m)   BMI Assessment: Estimated body mass index is 32.46 kg/m as calculated from the following:   Height as of this encounter: _1  (1.854 m).   Weight as of this encounter: 246 lb (111.6 kg).  BMI interpretation table: BMI level Category Range association with higher incidence of chronic pain  <18 kg/m2 Underweight   18.5-24.9 kg/m2 Ideal body weight   25-29.9 kg/m2 Overweight Increased incidence by 20%  30-34.9 kg/m2 Obese (Class I) Increased incidence by 68%  35-39.9 kg/m2 Severe obesity (Class II) Increased incidence by 136%  >40 kg/m2 Extreme obesity (Class III) Increased incidence  by 254%   Patient's current BMI Ideal Body weight  Body mass index is 32.46 kg/m. Ideal body weight: 79.9 kg (176 lb 2.4  oz) Adjusted ideal body weight: 92.6 kg (204 lb 1.4 oz)   BMI Readings from Last 4 Encounters:  02/24/18 32.46 kg/m  02/19/18 32.59 kg/m  02/03/18 32.46 kg/m  01/30/18 32.46 kg/m   Wt Readings from Last 4 Encounters:  02/24/18 246 lb (111.6 kg)  02/19/18 247 lb (112 kg)  02/03/18 246 lb (111.6 kg)  01/30/18 246 lb (111.6 kg)  Psych/Mental status: Alert, oriented x 3 (person, place, & time)       Eyes: PERLA Respiratory: No evidence of acute respiratory distress  Cervical Spine Area Exam  Skin & Axial Inspection: No masses, redness, edema, swelling, or associated skin lesions Alignment: Symmetrical Functional ROM: Unrestricted ROM      Stability: No instability detected Muscle Tone/Strength: Functionally intact. No obvious neuro-muscular anomalies detected. Sensory (Neurological): Unimpaired Palpation: No palpable anomalies              Upper Extremity (UE) Exam    Side: Right upper extremity  Side: Left upper extremity  Skin & Extremity Inspection: Skin color, temperature, and hair growth are WNL. No peripheral edema or cyanosis. No masses, redness, swelling, asymmetry, or associated skin lesions. No contractures.  Skin & Extremity Inspection: Skin color, temperature, and hair growth are WNL. No peripheral edema or cyanosis. No masses, redness, swelling, asymmetry, or associated skin lesions. No contractures.  Functional ROM: Unrestricted ROM          Functional ROM: Unrestricted ROM          Muscle Tone/Strength: Functionally intact. No obvious neuro-muscular anomalies detected.  Muscle Tone/Strength: Functionally intact. No obvious neuro-muscular anomalies detected.  Sensory (Neurological): Unimpaired          Sensory (Neurological): Unimpaired          Palpation: No palpable anomalies              Palpation: No palpable anomalies              Provocative Test(s):  Phalen's test: deferred Tinel's test: deferred Apley's scratch test (touch opposite shoulder):  Action 1  (Across chest): deferred Action 2 (Overhead): deferred Action 3 (LB reach): deferred   Provocative Test(s):  Phalen's test: deferred Tinel's test: deferred Apley's scratch test (touch opposite shoulder):  Action 1 (Across chest): deferred Action 2 (Overhead): deferred Action 3 (LB reach): deferred    Thoracic Spine Area Exam  Skin & Axial Inspection: No masses, redness, or swelling Alignment: Symmetrical Functional ROM: Unrestricted ROM Stability: No instability detected Muscle Tone/Strength: Functionally intact. No obvious neuro-muscular anomalies detected. Sensory (Neurological): Unimpaired Muscle strength & Tone: No palpable anomalies  Lumbar Spine Area Exam  Skin & Axial Inspection: No masses, redness, or swelling Alignment: Symmetrical Functional ROM: Improved after treatment       Stability: No instability detected Muscle Tone/Strength: Functionally intact. No obvious neuro-muscular anomalies detected. Sensory (Neurological): Improved Palpation: No palpable anomalies       Provocative Tests: Hyperextension/rotation test: Improved after treatment       Lumbar quadrant test (Kemp's test): Improved after treatment       Lateral bending test: Improved after treatment       Patrick's Maneuver: deferred today                   FABER* test: deferred today  S-I anterior distraction/compression test: deferred today         S-I lateral compression test: deferred today         S-I Thigh-thrust test: deferred today         S-I Gaenslen's test: deferred today         *(Flexion, ABduction and External Rotation)  Gait & Posture Assessment  Ambulation: Unassisted Gait: Relatively normal for age and body habitus Posture: WNL   Lower Extremity Exam    Side: Right lower extremity  Side: Left lower extremity  Stability: No instability observed          Stability: No instability observed          Skin & Extremity Inspection: Skin color, temperature, and hair growth  are WNL. No peripheral edema or cyanosis. No masses, redness, swelling, asymmetry, or associated skin lesions. No contractures.  Skin & Extremity Inspection: Skin color, temperature, and hair growth are WNL. No peripheral edema or cyanosis. No masses, redness, swelling, asymmetry, or associated skin lesions. No contractures.  Functional ROM: Unrestricted ROM                  Functional ROM: Unrestricted ROM                  Muscle Tone/Strength: Functionally intact. No obvious neuro-muscular anomalies detected.  Muscle Tone/Strength: Functionally intact. No obvious neuro-muscular anomalies detected.  Sensory (Neurological): Unimpaired        Sensory (Neurological): Unimpaired        DTR: Patellar: deferred today Achilles: deferred today Plantar: deferred today  DTR: Patellar: deferred today Achilles: deferred today Plantar: deferred today  Palpation: No palpable anomalies  Palpation: No palpable anomalies   Assessment  Primary Diagnosis & Pertinent Problem List: The primary encounter diagnosis was Lumbar facet arthropathy (L5/S1). Diagnoses of Facet arthritis, degenerative, lumbar spine and Chronic pain syndrome were also pertinent to this visit.  Status Diagnosis  Improving Improving Controlled 1. Lumbar facet arthropathy (L5/S1)   2. Facet arthritis, degenerative, lumbar spine   3. Chronic pain syndrome     Problems updated and reviewed during this visit: Problem  Lumbar Facet Arthropathy  Chronic Pain Syndrome   Follows up status post bilateral L3, L4, L5 facet medial branch nerve block #2.  Is endorsing pain relief and improvement in functional status.  He states that he is able to get out of his recliner without as much pain and usually on his first attempt.  He is overall pleased with the results.  We discussed radiofrequency ablation and the patient wants to hold off at this time.  Patient states that he is being evaluated for his hiatal hernia and may need surgical intervention  for this.  Patient is also endorsing right groin pain that is worse with external rotation.  This could suggest SI joint pathology.  Could consider SI joint x-rays and diagnostic sacroiliac joint injection in the future.  Plan: -PRN bilateral L3, L4, L5 facet medial branch nerve block -Consider radiofrequency ablation in the future -Consider SI joint x-rays, diagnostic right SI joint injection.  Lab-work, procedure(s), and/or referral(s): Orders Placed This Encounter  Procedures  . LUMBAR FACET(MEDIAL BRANCH NERVE BLOCK) MBNB   Time Note: Greater than 50% of the 25 minute(s) of face-to-face time spent with Mr. Osbourne, was spent in counseling/coordination of care regarding: Mr. Mitton primary cause of pain, the treatment plan, treatment alternatives, the risks and possible complications of proposed treatment, the results, interpretation and significance of  his  recent diagnostic interventional treatment(s), realistic expectations, the goals of pain management (increased in functionality) and the need to bring and keep the BMI below 30.  Provider-requested follow-up: Return if symptoms worsen or fail to improve.  Future Appointments  Date Time Provider [place]  02/25/2018  1:30 PM [id number]    Primary Care Physician: Kirk Ruths, MD Location: [facility] Management [facility] Note by: Gillis Santa, M.D Date: 02/24/2018; Time: 3:02 PM  Patient Instructions   Try piriformis release exercise with tennis ball  When pain returns to higher levels, call clinic to schedule repeat facet blocks   Moderate Conscious Sedation, Adult Sedation is the use of medicines to promote relaxation and relieve discomfort and anxiety. Moderate conscious sedation is a type of sedation. Under moderate conscious sedation, you are less alert than normal, but you are still able to respond to instructions, touch, or both. Moderate conscious sedation is used during  short medical and dental procedures. It is milder than deep sedation, which is a type of sedation under which you cannot be easily woken up. It is also milder than general anesthesia, which is the use of medicines to make you unconscious. Moderate conscious sedation allows you to return to your regular activities sooner. Tell a health care provider about:  Any allergies you have.  All medicines you are taking, including vitamins, herbs, eye drops, creams, and over-the-counter medicines.  Use of steroids (by mouth or creams).  Any problems you or family members have had with sedatives and anesthetic medicines.  Any blood disorders you have.  Any surgeries you have had.  Any medical conditions you have, such as sleep apnea.  Whether you are pregnant or may be pregnant.  Any use of cigarettes, alcohol, marijuana, or street drugs. What are the risks? Generally, this is a safe procedure. However, problems may occur, including:  Getting too much medicine (oversedation).  Nausea.  Allergic reaction to medicines.  Trouble breathing. If this happens, a breathing tube may be used to help with breathing. It will be removed when you are awake and breathing on your own.  Heart trouble.  Lung trouble.  What happens before the procedure? Staying hydrated Follow instructions from your health care provider about hydration, which may include:  Up to 2 hours before the procedure - you may continue to drink clear liquids, such as water, clear fruit juice, black coffee, and plain tea.  Eating and drinking restrictions Follow instructions from your health care provider about eating and drinking, which may include:  8 hours before the procedure - stop eating heavy meals or foods such as meat, fried foods, or fatty foods.  6 hours before the procedure - stop eating light meals or foods, such as toast or cereal.  6 hours before the procedure - stop drinking milk or drinks that contain  milk.  2 hours before the procedure - stop drinking clear liquids.  Medicine  Ask your health care provider about:  Changing or stopping your regular medicines. This is especially important if you are taking diabetes medicines or blood thinners.  Taking medicines such as aspirin and ibuprofen. These medicines can thin your blood. Do not take these medicines before your procedure if your health care provider instructs you not to.  Tests and exams  You will have a physical exam.  You may have blood tests done to show: ? How well your kidneys and liver are working. ? How well your blood can clot. General instructions  Plan to have someone take you home from the hospital or clinic.  If you will be going home right after the procedure, plan to have someone with you for 24 hours. What happens during the procedure?  An IV tube will be inserted into one of your veins.  Medicine to help you relax (sedative) will be given through the IV tube.  The medical or dental procedure will be performed. What happens after the procedure?  Your blood pressure, heart rate, breathing rate, and blood oxygen level will be monitored often until the medicines you were given have worn off.  Do not drive for 24 hours. This information is not intended to replace advice given to you by your health care provider. Make sure you discuss any questions you have with your health care provider. Document Released: 11/21/2000 Document Revised: 08/02/2015 Document Reviewed: 06/18/2015 Elsevier Interactive Patient Education  2018 [place]  What are the risk, side effects and possible complications? Generally speaking, most procedures are safe.  However, with any procedure there are risks, side effects, and the possibility of complications.  The risks and complications are dependent upon the sites that are lesioned, or the type of nerve block to be performed.  The closer the  procedure is to the spine, the more serious the risks are.  Great care is taken when placing the radio frequency needles, block needles or lesioning probes, but sometimes complications can occur. 1. Infection: Any time there is an injection through the skin, there is a risk of infection.  This is why sterile conditions are used for these blocks.  There are four possible types of infection. 1. Localized skin infection. 2. Central Nervous System Infection-This can be in the form of Meningitis, which can be deadly. 3. Epidural Infections-This can be in the form of an epidural abscess, which can cause pressure inside of the spine, causing compression of the spinal cord with subsequent paralysis. This would require an emergency surgery to decompress, and there are no guarantees that the patient would recover from the paralysis. 4. Discitis-This is an infection of the intervertebral discs.  It occurs in about 1% of discography procedures.  It is difficult to treat and it may lead to surgery.        2. Pain: the needles have to go through skin and soft tissues, will cause soreness.       3. Damage to internal structures:  The nerves to be lesioned may be near blood vessels or    other nerves which can be potentially damaged.       4. Bleeding: Bleeding is more common if the patient is taking blood thinners such as  aspirin, Coumadin, Ticiid, Plavix, etc., or if he/she have some genetic predisposition  such as hemophilia. Bleeding into the spinal canal can cause compression of the spinal  cord with subsequent paralysis.  This would require an emergency surgery to  decompress and there are no guarantees that the patient would recover from the  paralysis.       5. Pneumothorax:  Puncturing of a lung is a possibility, every time a needle is introduced in  the area of the chest or upper back.  Pneumothorax refers to free air around the  collapsed lung(s), inside of the thoracic cavity (chest cavity).  Another two  possible  complications related to a similar event would include: Hemothorax and Chylothorax.   These are variations of the Pneumothorax, where instead of air around  the collapsed  lung(s), you may have blood or chyle, respectively.       6. Spinal headaches: They may occur with any procedures in the area of the spine.       7. Persistent CSF (Cerebro-Spinal Fluid) leakage: This is a rare problem, but may occur  with prolonged intrathecal or epidural catheters either due to the formation of a fistulous  track or a dural tear.       8. Nerve damage: By working so close to the spinal cord, there is always a possibility of  nerve damage, which could be as serious as a permanent spinal cord injury with  paralysis.       9. Death:  Although rare, severe deadly allergic reactions known as "Anaphylactic  reaction" can occur to any of the medications used.      10. Worsening of the symptoms:  We can always make thing worse.  What are the chances of something like this happening? Chances of any of this occuring are extremely low.  By statistics, you have more of a chance of getting killed in a motor vehicle accident: while driving to the hospital than any of the above occurring .  Nevertheless, you should be aware that they are possibilities.  In general, it is similar to taking a shower.  Everybody knows that you can slip, hit your head and get killed.  Does that mean that you should not shower again?  Nevertheless always keep in mind that statistics do not mean anything if you happen to be on the wrong side of them.  Even if a procedure has a 1 (one) in a 1,000,000 (million) chance of going wrong, it you happen to be that one..Also, keep in mind that by statistics, you have more of a chance of having something go wrong when taking medications.  Who should not have this procedure? If you are on a blood thinning medication (e.g. Coumadin, Plavix, see list of "Blood Thinners"), or if you have an active infection  going on, you should not have the procedure.  If you are taking any blood thinners, please inform your physician.  How should I prepare for this procedure?  Do not eat or drink anything at least six hours prior to the procedure.  Bring a driver with you .  It cannot be a taxi.  Come accompanied by an adult that can drive you back, and that is strong enough to help you if your legs get weak or numb from the local anesthetic.  Take all of your medicines the morning of the procedure with just enough water to swallow them.  If you have diabetes, make sure that you are scheduled to have your procedure done first thing in the morning, whenever possible.  If you have diabetes, take only half of your insulin dose and notify our nurse that you have done so as soon as you arrive at the clinic.  If you are diabetic, but only take blood sugar pills (oral hypoglycemic), then do not take them on the morning of your procedure.  You may take them after you have had the procedure.  Do not take aspirin or any aspirin-containing medications, at least eleven (11) days prior to the procedure.  They may prolong bleeding.  Wear loose fitting clothing that may be easy to take off and that you would not mind if it got stained with Betadine or blood.  Do not wear any jewelry or perfume  Remove any nail coloring.  It will interfere with some of our monitoring equipment.  NOTE: Remember that this is not meant to be interpreted as a complete list of all possible complications.  Unforeseen problems may occur.  BLOOD THINNERS The following drugs contain aspirin or other products, which can cause increased bleeding during surgery and should not be taken for 2 weeks prior to and 1 week after surgery.  If you should need take something for relief of minor pain, you may take acetaminophen which is found in Tylenol,m Datril, Anacin-3 and Panadol. It is not blood thinner. The products listed below are.  Do not take any of  the products listed below in addition to any listed on your instruction sheet.  A.P.C or A.P.C with Codeine Codeine Phosphate Capsules #3 Ibuprofen Ridaura  ABC compound Congesprin Imuran rimadil  Advil Cope Indocin Robaxisal  Alka-Seltzer Effervescent Pain Reliever and Antacid Coricidin or Coricidin-D  Indomethacin Rufen  Alka-Seltzer plus Cold Medicine Cosprin Ketoprofen S-A-C Tablets  Anacin Analgesic Tablets or Capsules Coumadin Korlgesic Salflex  Anacin Extra Strength Analgesic tablets or capsules CP-2 Tablets Lanoril Salicylate  Anaprox Cuprimine Capsules Levenox Salocol  Anexsia-D Dalteparin Magan Salsalate  Anodynos Darvon compound Magnesium Salicylate Sine-off  Ansaid Dasin Capsules Magsal Sodium Salicylate  Anturane Depen Capsules Marnal Soma  APF Arthritis pain formula Dewitt's Pills Measurin Stanback  Argesic Dia-Gesic Meclofenamic Sulfinpyrazone  Arthritis Bayer Timed Release Aspirin Diclofenac Meclomen Sulindac  Arthritis pain formula Anacin Dicumarol Medipren Supac  Analgesic (Safety coated) Arthralgen Diffunasal Mefanamic Suprofen  Arthritis Strength Bufferin Dihydrocodeine Mepro Compound Suprol  Arthropan liquid Dopirydamole Methcarbomol with Aspirin Synalgos  ASA tablets/Enseals Disalcid Micrainin Tagament  Ascriptin Doan's Midol Talwin  Ascriptin A/D Dolene Mobidin Tanderil  Ascriptin Extra Strength Dolobid Moblgesic Ticlid  Ascriptin with Codeine Doloprin or Doloprin with Codeine Momentum Tolectin  Asperbuf Duoprin Mono-gesic Trendar  Aspergum Duradyne Motrin or Motrin IB Triminicin  Aspirin plain, buffered or enteric coated Durasal Myochrisine Trigesic  Aspirin Suppositories Easprin Nalfon Trillsate  Aspirin with Codeine Ecotrin Regular or Extra Strength Naprosyn Uracel  Atromid-S Efficin Naproxen Ursinus  Auranofin Capsules Elmiron Neocylate Vanquish  Axotal Emagrin Norgesic Verin  Azathioprine Empirin or Empirin with Codeine Normiflo Vitamin E  Azolid  Emprazil Nuprin Voltaren  Bayer Aspirin plain, buffered or children's or timed BC Tablets or powders Encaprin Orgaran Warfarin Sodium  Buff-a-Comp Enoxaparin Orudis Zorpin  Buff-a-Comp with Codeine Equegesic Os-Cal-Gesic   Buffaprin Excedrin plain, buffered or Extra Strength Oxalid   Bufferin Arthritis Strength Feldene Oxphenbutazone   Bufferin plain or Extra Strength Feldene Capsules Oxycodone with Aspirin   Bufferin with Codeine Fenoprofen Fenoprofen Pabalate or Pabalate-SF   Buffets II Flogesic Panagesic   Buffinol plain or Extra Strength Florinal or Florinal with Codeine Panwarfarin   Buf-Tabs Flurbiprofen Penicillamine   Butalbital Compound Four-way cold tablets Penicillin   Butazolidin Fragmin Pepto-Bismol   Carbenicillin Geminisyn Percodan   Carna Arthritis Reliever Geopen Persantine   Carprofen Gold's salt Persistin   Chloramphenicol Goody's Phenylbutazone   Chloromycetin Haltrain Piroxlcam   Clmetidine heparin Plaquenil   Cllnoril Hyco-pap Ponstel   Clofibrate Hydroxy chloroquine Propoxyphen         Before stopping any of these medications, be sure to consult the physician who ordered them.  Some, such as Coumadin (Warfarin) are ordered to prevent or treat serious conditions such as "deep thrombosis", "pumonary embolisms", and other heart problems.  The amount of time that you may need off of the medication may also vary with the medication and the reason for which you  were taking it.  If you are taking any of these medications, please make sure you notify your pain physician before you undergo any procedures.    Facet Blocks Patient Information  Description: The facets are joints in the spine between the vertebrae.  Like any joints in the body, facets can become irritated and painful.  Arthritis can also effect the facets.  By injecting steroids and local anesthetic in and around these joints, we can temporarily block the nerve supply to them.  Steroids act directly on  irritated nerves and tissues to reduce selling and inflammation which often leads to decreased pain.  Facet blocks may be done anywhere along the spine from the neck to the low back depending upon the location of your pain.   After numbing the skin with local anesthetic (like Novocaine), a small needle is passed onto the facet joints under x-ray guidance.  You may experience a sensation of pressure while this is being done.  The entire block usually lasts about 15-25 minutes.   Conditions which may be treated by facet blocks:   Low back/buttock pain  Neck/shoulder pain  Certain types of headaches  Preparation for the injection:  1. Do not eat any solid food or dairy products within 8 hours of your appointment. 2. You may drink clear liquid up to 3 hours before appointment.  Clear liquids include water, black coffee, juice or soda.  No milk or cream please. 3. You may take your regular medication, including pain medications, with a sip of water before your appointment.  Diabetics should hold regular insulin (if taken separately) and take 1/2 normal NPH dose the morning of the procedure.  Carry some sugar containing items with you to your appointment. 4. A driver must accompany you and be prepared to drive you home after your procedure. 5. Bring all your current medications with you. 6. An IV may be inserted and sedation may be given at the discretion of the physician. 7. A blood pressure cuff, EKG and other monitors will often be applied during the procedure.  Some patients may need to have extra oxygen administered for a short period. 8. You will be asked to provide medical information, including your allergies and medications, prior to the procedure.  We must know immediately if you are taking blood thinners (like Coumadin/Warfarin) or if you are allergic to IV iodine contrast (dye).  We must know if you could possible be pregnant.  Possible side-effects:   Bleeding from needle  site  Infection (rare, may require surgery)  Nerve injury (rare)  Numbness & tingling (temporary)  Difficulty urinating (rare, temporary)  Spinal headache (a headache worse with upright posture)  Light-headedness (temporary)  Pain at injection site (serveral days)  Decreased blood pressure (rare, temporary)  Weakness in arm/leg (temporary)  Pressure sensation in back/neck (temporary)   Call if you experience:   Fever/chills associated with headache or increased back/neck pain  Headache worsened by an upright position  New onset, weakness or numbness of an extremity below the injection site  Hives or difficulty breathing (go to the emergency room)  Inflammation or drainage at the injection site(s)  Severe back/neck pain greater than usual  New symptoms which are concerning to you  Please note:  Although the local anesthetic injected can often make your back or neck feel good for several hours after the injection, the pain will likely return. It takes 3-7 days for steroids to work.  You may not notice any pain relief for  at least one week.  If effective, we will often do a series of 2-3 injections spaced 3-6 weeks apart to maximally decrease your pain.  After the initial series, you may be a candidate for a more permanent nerve block of the facets.  If you have any questions, please call #[phone] [facility]

## 2018-02-24 NOTE — Progress Notes (Signed)
Safety precautions to be maintained throughout the outpatient stay will include: orient to surroundings, keep bed in low position, maintain call bell within reach at all times, provide assistance with transfer out of bed and ambulation.  

## 2018-02-24 NOTE — Patient Instructions (Addendum)
Try piriformis release exercise with tennis ball  When pain returns to higher levels, call clinic to schedule repeat facet blocks   Moderate Conscious Sedation, Adult Sedation is the use of medicines to promote relaxation and relieve discomfort and anxiety. Moderate conscious sedation is a type of sedation. Under moderate conscious sedation, you are less alert than normal, but you are still able to respond to instructions, touch, or both. Moderate conscious sedation is used during short medical and dental procedures. It is milder than deep sedation, which is a type of sedation under which you cannot be easily woken up. It is also milder than general anesthesia, which is the use of medicines to make you unconscious. Moderate conscious sedation allows you to return to your regular activities sooner. Tell a health care provider about:  Any allergies you have.  All medicines you are taking, including vitamins, herbs, eye drops, creams, and over-the-counter medicines.  Use of steroids (by mouth or creams).  Any problems you or family members have had with sedatives and anesthetic medicines.  Any blood disorders you have.  Any surgeries you have had.  Any medical conditions you have, such as sleep apnea.  Whether you are pregnant or may be pregnant.  Any use of cigarettes, alcohol, marijuana, or street drugs. What are the risks? Generally, this is a safe procedure. However, problems may occur, including:  Getting too much medicine (oversedation).  Nausea.  Allergic reaction to medicines.  Trouble breathing. If this happens, a breathing tube may be used to help with breathing. It will be removed when you are awake and breathing on your own.  Heart trouble.  Lung trouble.  What happens before the procedure? Staying hydrated Follow instructions from your health care provider about hydration, which may include:  Up to 2 hours before the procedure - you may continue to drink clear  liquids, such as water, clear fruit juice, black coffee, and plain tea.  Eating and drinking restrictions Follow instructions from your health care provider about eating and drinking, which may include:  8 hours before the procedure - stop eating heavy meals or foods such as meat, fried foods, or fatty foods.  6 hours before the procedure - stop eating light meals or foods, such as toast or cereal.  6 hours before the procedure - stop drinking milk or drinks that contain milk.  2 hours before the procedure - stop drinking clear liquids.  Medicine  Ask your health care provider about:  Changing or stopping your regular medicines. This is especially important if you are taking diabetes medicines or blood thinners.  Taking medicines such as aspirin and ibuprofen. These medicines can thin your blood. Do not take these medicines before your procedure if your health care provider instructs you not to.  Tests and exams  You will have a physical exam.  You may have blood tests done to show: ? How well your kidneys and liver are working. ? How well your blood can clot. General instructions  Plan to have someone take you home from the hospital or clinic.  If you will be going home right after the procedure, plan to have someone with you for 24 hours. What happens during the procedure?  An IV tube will be inserted into one of your veins.  Medicine to help you relax (sedative) will be given through the IV tube.  The medical or dental procedure will be performed. What happens after the procedure?  Your blood pressure, heart rate, breathing rate, and  blood oxygen level will be monitored often until the medicines you were given have worn off.  Do not drive for 24 hours. This information is not intended to replace advice given to you by your health care provider. Make sure you discuss any questions you have with your health care provider. Document Released: 11/21/2000 Document Revised:  08/02/2015 Document Reviewed: 06/18/2015 Elsevier Interactive Patient Education  2018 [employer]  What are the risk, side effects and possible complications? Generally speaking, most procedures are safe.  However, with any procedure there are risks, side effects, and the possibility of complications.  The risks and complications are dependent upon the sites that are lesioned, or the type of nerve block to be performed.  The closer the procedure is to the spine, the more serious the risks are.  Great care is taken when placing the radio frequency needles, block needles or lesioning probes, but sometimes complications can occur. 1. Infection: Any time there is an injection through the skin, there is a risk of infection.  This is why sterile conditions are used for these blocks.  There are four possible types of infection. 1. Localized skin infection. 2. Central Nervous System Infection-This can be in the form of Meningitis, which can be deadly. 3. Epidural Infections-This can be in the form of an epidural abscess, which can cause pressure inside of the spine, causing compression of the spinal cord with subsequent paralysis. This would require an emergency surgery to decompress, and there are no guarantees that the patient would recover from the paralysis. 4. Discitis-This is an infection of the intervertebral discs.  It occurs in about 1% of discography procedures.  It is difficult to treat and it may lead to surgery.        2. Pain: the needles have to go through skin and soft tissues, will cause soreness.       3. Damage to internal structures:  The nerves to be lesioned may be near blood vessels or    other nerves which can be potentially damaged.       4. Bleeding: Bleeding is more common if the patient is taking blood thinners such as  aspirin, Coumadin, Ticiid, Plavix, etc., or if he/she have some genetic predisposition  such as hemophilia. Bleeding into the  spinal canal can cause compression of the spinal  cord with subsequent paralysis.  This would require an emergency surgery to  decompress and there are no guarantees that the patient would recover from the  paralysis.       5. Pneumothorax:  Puncturing of a lung is a possibility, every time a needle is introduced in  the area of the chest or upper back.  Pneumothorax refers to free air around the  collapsed lung(s), inside of the thoracic cavity (chest cavity).  Another two possible  complications related to a similar event would include: Hemothorax and Chylothorax.   These are variations of the Pneumothorax, where instead of air around the collapsed  lung(s), you may have blood or chyle, respectively.       6. Spinal headaches: They may occur with any procedures in the area of the spine.       7. Persistent CSF (Cerebro-Spinal Fluid) leakage: This is a rare problem, but may occur  with prolonged intrathecal or epidural catheters either due to the formation of a fistulous  track or a dural tear.       8. Nerve damage: By working so close  to the spinal cord, there is always a possibility of  nerve damage, which could be as serious as a permanent spinal cord injury with  paralysis.       9. Death:  Although rare, severe deadly allergic reactions known as "Anaphylactic  reaction" can occur to any of the medications used.      10. Worsening of the symptoms:  We can always make thing worse.  What are the chances of something like this happening? Chances of any of this occuring are extremely low.  By statistics, you have more of a chance of getting killed in a motor vehicle accident: while driving to the hospital than any of the above occurring .  Nevertheless, you should be aware that they are possibilities.  In general, it is similar to taking a shower.  Everybody knows that you can slip, hit your head and get killed.  Does that mean that you should not shower again?  Nevertheless always keep in mind that  statistics do not mean anything if you happen to be on the wrong side of them.  Even if a procedure has a 1 (one) in a 1,000,000 (million) chance of going wrong, it you happen to be that one..Also, keep in mind that by statistics, you have more of a chance of having something go wrong when taking medications.  Who should not have this procedure? If you are on a blood thinning medication (e.g. Coumadin, Plavix, see list of "Blood Thinners"), or if you have an active infection going on, you should not have the procedure.  If you are taking any blood thinners, please inform your physician.  How should I prepare for this procedure?  Do not eat or drink anything at least six hours prior to the procedure.  Bring a driver with you .  It cannot be a taxi.  Come accompanied by an adult that can drive you back, and that is strong enough to help you if your legs get weak or numb from the local anesthetic.  Take all of your medicines the morning of the procedure with just enough water to swallow them.  If you have diabetes, make sure that you are scheduled to have your procedure done first thing in the morning, whenever possible.  If you have diabetes, take only half of your insulin dose and notify our nurse that you have done so as soon as you arrive at the clinic.  If you are diabetic, but only take blood sugar pills (oral hypoglycemic), then do not take them on the morning of your procedure.  You may take them after you have had the procedure.  Do not take aspirin or any aspirin-containing medications, at least eleven (11) days prior to the procedure.  They may prolong bleeding.  Wear loose fitting clothing that may be easy to take off and that you would not mind if it got stained with Betadine or blood.  Do not wear any jewelry or perfume  Remove any nail coloring.  It will interfere with some of our monitoring equipment.  NOTE: Remember that this is not meant to be interpreted as a complete  list of all possible complications.  Unforeseen problems may occur.  BLOOD THINNERS The following drugs contain aspirin or other products, which can cause increased bleeding during surgery and should not be taken for 2 weeks prior to and 1 week after surgery.  If you should need take something for relief of minor pain, you may take acetaminophen which is  found in Tylenol,m Datril, Anacin-3 and Panadol. It is not blood thinner. The products listed below are.  Do not take any of the products listed below in addition to any listed on your instruction sheet.  A.P.C or A.P.C with Codeine Codeine Phosphate Capsules #3 Ibuprofen Ridaura  ABC compound Congesprin Imuran rimadil  Advil Cope Indocin Robaxisal  Alka-Seltzer Effervescent Pain Reliever and Antacid Coricidin or Coricidin-D  Indomethacin Rufen  Alka-Seltzer plus Cold Medicine Cosprin Ketoprofen S-A-C Tablets  Anacin Analgesic Tablets or Capsules Coumadin Korlgesic Salflex  Anacin Extra Strength Analgesic tablets or capsules CP-2 Tablets Lanoril Salicylate  Anaprox Cuprimine Capsules Levenox Salocol  Anexsia-D Dalteparin Magan Salsalate  Anodynos Darvon compound Magnesium Salicylate Sine-off  Ansaid Dasin Capsules Magsal Sodium Salicylate  Anturane Depen Capsules Marnal Soma  APF Arthritis pain formula Dewitt's Pills Measurin Stanback  Argesic Dia-Gesic Meclofenamic Sulfinpyrazone  Arthritis Bayer Timed Release Aspirin Diclofenac Meclomen Sulindac  Arthritis pain formula Anacin Dicumarol Medipren Supac  Analgesic (Safety coated) Arthralgen Diffunasal Mefanamic Suprofen  Arthritis Strength Bufferin Dihydrocodeine Mepro Compound Suprol  Arthropan liquid Dopirydamole Methcarbomol with Aspirin Synalgos  ASA tablets/Enseals Disalcid Micrainin Tagament  Ascriptin Doan's Midol Talwin  Ascriptin A/D Dolene Mobidin Tanderil  Ascriptin Extra Strength Dolobid Moblgesic Ticlid  Ascriptin with Codeine Doloprin or Doloprin with Codeine Momentum  Tolectin  Asperbuf Duoprin Mono-gesic Trendar  Aspergum Duradyne Motrin or Motrin IB Triminicin  Aspirin plain, buffered or enteric coated Durasal Myochrisine Trigesic  Aspirin Suppositories Easprin Nalfon Trillsate  Aspirin with Codeine Ecotrin Regular or Extra Strength Naprosyn Uracel  Atromid-S Efficin Naproxen Ursinus  Auranofin Capsules Elmiron Neocylate Vanquish  Axotal Emagrin Norgesic Verin  Azathioprine Empirin or Empirin with Codeine Normiflo Vitamin E  Azolid Emprazil Nuprin Voltaren  Bayer Aspirin plain, buffered or children's or timed BC Tablets or powders Encaprin Orgaran Warfarin Sodium  Buff-a-Comp Enoxaparin Orudis Zorpin  Buff-a-Comp with Codeine Equegesic Os-Cal-Gesic   Buffaprin Excedrin plain, buffered or Extra Strength Oxalid   Bufferin Arthritis Strength Feldene Oxphenbutazone   Bufferin plain or Extra Strength Feldene Capsules Oxycodone with Aspirin   Bufferin with Codeine Fenoprofen Fenoprofen Pabalate or Pabalate-SF   Buffets II Flogesic Panagesic   Buffinol plain or Extra Strength Florinal or Florinal with Codeine Panwarfarin   Buf-Tabs Flurbiprofen Penicillamine   Butalbital Compound Four-way cold tablets Penicillin   Butazolidin Fragmin Pepto-Bismol   Carbenicillin Geminisyn Percodan   Carna Arthritis Reliever Geopen Persantine   Carprofen Gold's salt Persistin   Chloramphenicol Goody's Phenylbutazone   Chloromycetin Haltrain Piroxlcam   Clmetidine heparin Plaquenil   Cllnoril Hyco-pap Ponstel   Clofibrate Hydroxy chloroquine Propoxyphen         Before stopping any of these medications, be sure to consult the physician who ordered them.  Some, such as Coumadin (Warfarin) are ordered to prevent or treat serious conditions such as "deep thrombosis", "pumonary embolisms", and other heart problems.  The amount of time that you may need off of the medication may also vary with the medication and the reason for which you were taking it.  If you are taking any  of these medications, please make sure you notify your pain physician before you undergo any procedures.    Facet Blocks Patient Information  Description: The facets are joints in the spine between the vertebrae.  Like any joints in the body, facets can become irritated and painful.  Arthritis can also effect the facets.  By injecting steroids and local anesthetic in and around these joints, we can temporarily block the nerve supply to  them.  Steroids act directly on irritated nerves and tissues to reduce selling and inflammation which often leads to decreased pain.  Facet blocks may be done anywhere along the spine from the neck to the low back depending upon the location of your pain.   After numbing the skin with local anesthetic (like Novocaine), a small needle is passed onto the facet joints under x-ray guidance.  You may experience a sensation of pressure while this is being done.  The entire block usually lasts about 15-25 minutes.   Conditions which may be treated by facet blocks:   Low back/buttock pain  Neck/shoulder pain  Certain types of headaches  Preparation for the injection:  1. Do not eat any solid food or dairy products within 8 hours of your appointment. 2. You may drink clear liquid up to 3 hours before appointment.  Clear liquids include water, black coffee, juice or soda.  No milk or cream please. 3. You may take your regular medication, including pain medications, with a sip of water before your appointment.  Diabetics should hold regular insulin (if taken separately) and take 1/2 normal NPH dose the morning of the procedure.  Carry some sugar containing items with you to your appointment. 4. A driver must accompany you and be prepared to drive you home after your procedure. 5. Bring all your current medications with you. 6. An IV may be inserted and sedation may be given at the discretion of the physician. 7. A blood pressure cuff, EKG and other monitors will often  be applied during the procedure.  Some patients may need to have extra oxygen administered for a short period. 8. You will be asked to provide medical information, including your allergies and medications, prior to the procedure.  We must know immediately if you are taking blood thinners (like Coumadin/Warfarin) or if you are allergic to IV iodine contrast (dye).  We must know if you could possible be pregnant.  Possible side-effects:   Bleeding from needle site  Infection (rare, may require surgery)  Nerve injury (rare)  Numbness & tingling (temporary)  Difficulty urinating (rare, temporary)  Spinal headache (a headache worse with upright posture)  Light-headedness (temporary)  Pain at injection site (serveral days)  Decreased blood pressure (rare, temporary)  Weakness in arm/leg (temporary)  Pressure sensation in back/neck (temporary)   Call if you experience:   Fever/chills associated with headache or increased back/neck pain  Headache worsened by an upright position  New onset, weakness or numbness of an extremity below the injection site  Hives or difficulty breathing (go to the emergency room)  Inflammation or drainage at the injection site(s)  Severe back/neck pain greater than usual  New symptoms which are concerning to you  Please note:  Although the local anesthetic injected can often make your back or neck feel good for several hours after the injection, the pain will likely return. It takes 3-7 days for steroids to work.  You may not notice any pain relief for at least one week.  If effective, we will often do a series of 2-3 injections spaced 3-6 weeks apart to maximally decrease your pain.  After the initial series, you may be a candidate for a more permanent nerve block of the facets.  If you have any questions, please call #[phone]) [facility]

## 2018-02-25 ENCOUNTER — Ambulatory Visit
Admission: RE | Admit: 2018-02-25 | Discharge: 2018-02-25 | Disposition: A | Discharge status: Home / Self Care | Payer: Medicare Other | Source: Ambulatory Visit | Attending: Gastroenterology | Admitting: Gastroenterology

## 2018-02-25 DIAGNOSIS — K449 Diaphragmatic hernia without obstruction or gangrene: Secondary | ICD-10-CM | POA: Insufficient documentation

## 2018-02-25 DIAGNOSIS — G894 Chronic pain syndrome: Secondary | ICD-10-CM | POA: Insufficient documentation

## 2018-02-25 DIAGNOSIS — K219 Gastro-esophageal reflux disease without esophagitis: Secondary | ICD-10-CM | POA: Diagnosis not present

## 2018-02-25 DIAGNOSIS — M47816 Spondylosis without myelopathy or radiculopathy, lumbar region: Secondary | ICD-10-CM | POA: Insufficient documentation

## 2018-09-03 DIAGNOSIS — D229 Melanocytic nevi, unspecified: Secondary | ICD-10-CM

## 2018-09-03 HISTORY — DX: Melanocytic nevi, unspecified: D22.9

## 2019-02-23 ENCOUNTER — Other Ambulatory Visit: Payer: Self-pay | Admitting: Student in an Organized Health Care Education/Training Program

## 2019-02-23 DIAGNOSIS — M47816 Spondylosis without myelopathy or radiculopathy, lumbar region: Secondary | ICD-10-CM

## 2019-09-01 ENCOUNTER — Other Ambulatory Visit: Payer: Self-pay | Admitting: Dermatology

## 2019-10-27 ENCOUNTER — Ambulatory Visit: Payer: Medicare Other | Attending: Internal Medicine

## 2019-10-27 ENCOUNTER — Other Ambulatory Visit: Payer: Self-pay

## 2019-10-27 DIAGNOSIS — R2681 Unsteadiness on feet: Secondary | ICD-10-CM

## 2019-10-27 NOTE — Patient Instructions (Signed)
Access Code: [email or web address] URL: [email or web address] Date: 10/27/2019 Prepared by: Roxana Hires  Exercises Sit to Stand - 1 x daily - 7 x weekly - 2 sets - 10 reps Standing March with Counter Support - 1 x daily - 7 x weekly - 2 sets - 10 reps Romberg Stance - 1 x daily - 7 x weekly - 2 sets - 30 secs hold

## 2019-10-27 NOTE — Therapy (Signed)
[place] MAIN [facility] SERVICES [address] [place], [place], [address] Phone: [phone]   Fax:  [phone]  Physical Therapy Evaluation  Patient Details  Name: Timothy Farley MRN: [id number] Date of Birth: 09-Oct-1975 Referring Provider (PT): Dr. Bethann Punches   Encounter Date: 10/27/2019   PT End of Session - 10/27/19 1925    Visit Number 1    Number of Visits 17    Date for PT Re-Evaluation 12/22/19    Authorization Type Eval: 10/27/19    PT Start Time 1433    PT Stop Time 1535    PT Time Calculation (min) 62 min    Equipment Utilized During Treatment Gait belt    Activity Tolerance Patient tolerated treatment well    Behavior During Therapy Impulsive           Past Medical History:  Diagnosis Date   Arthritis    GERD (gastroesophageal reflux disease)    Left foot drop    Pleurisy    Short-term memory loss    Subdural hematoma (HCC)    TBI (traumatic brain injury) ([place]) 1994    Past Surgical History:  Procedure Laterality Date   BRAIN SURGERY     brain surgery x3     ESOPHAGOGASTRODUODENOSCOPY (EGD) WITH PROPOFOL N/A 02/19/2018   Procedure: ESOPHAGOGASTRODUODENOSCOPY (EGD) WITH PROPOFOL;  Surgeon: Toledo, Benay Pike, MD;  Location: [facility] ENDOSCOPY;  Service: Gastroenterology;  Laterality: N/A;   GASTROSTOMY W/ FEEDING TUBE     trachostemy      There were no vitals filed for this visit.     Subjective Assessment - 10/27/19 1927    Subjective Falls    Pertinent History Patient is a 44 year old male with history of falling/unbalance. He notes that the room is always spinning and he has to hold onto his mom's shirt/arm when walking. He has had 3 falls in the last few months. One of which was taking a step backwards, another forward, and the third his L foot got stuck and he fell forward. He notes that his L knee buckles. He had a TBI when he was younger, and is L side was affected most. He has back pain that goes down  his L leg. He used to get Cortizone shots before his insurance stopped paying for them. His goal is to walk down the street confidently without holding on to anything. He notes that he does not want to walk with a cane or walker.    Limitations House hold activities;Walking    Patient Stated Goals Walk down the street confidently without holding on to anything    Currently in Pain? Yes    Pain Score 6     Pain Location Back    Pain Orientation Lower    Pain Type Chronic pain    Pain Radiating Towards L LE    Pain Onset More than a month ago    Pain Frequency Intermittent             SUBJECTIVE Chief complaint: Patient is a 44 year old male with history of falling/unbalance. He notes that the room is always spinning and he has to hold onto his mom's shirt/arm when walking. He has had 3 falls in the last few months. One of which was taking a step backwards, another forward, and the third his L foot got stuck and he fell forward. He notes that his L knee buckles. He had a TBI when he was younger, and is  L side was affected most. He has back pain that goes down his L leg. He used to get Cortizone shots before his insurance stopped paying for them. His goal is to walk down the street confidently without holding on to anything. He notes that he does not want to walk with a cane or walker.  Recent changes in overall health/medication: No Directional pattern for falls: Forwards and backwards with initial steps from getting up, L foot also catches since he has foot drop and no activation of L tibialis anterior. Prior history of physical therapy for balance: Yes, in 2018. Follow-up appointment with MD: None scheduled Red flags (bowel/bladder changes, saddle paresthesia, personal history of cancer, chills/fever, night sweats, unrelenting pain) Negative   OBJECTIVE  MUSCULOSKELETAL: Tremor: Absent Bulk: Normal Tone: Normal, no clonus  Posture Rounded upper back with forward head. Flattened  lower back.  Gait Patient walks with ataxic gait and foot drop on the L with wide base of support. Significant external rotation of LLE during ambulation.   Strength R/L 5/4+ Hip flexion 5/4+ Knee extension 5/4+ Knee flexion Active Ankle Plantarflexion bilaterally, stronger on the right 3+/0 Ankle Dorsiflexion, no muscle activation of tib anterior on L foot. UE: Left side weaker than R but not graded.  NEUROLOGICAL:  Mental Status Patient is oriented to person, place and time.  Recent memory is intact.  Remote memory is intact. Attention span and concentration are divided. Pt regularly gets distracted and frequently perseverates on topics.  Expressive speech is intact.  Patient's fund of knowledge is within normal limits for educational level.  Cranial Nerves Visual acuity and visual fields are intact  Extraocular muscles are intact  Hearing is normal as tested by gross conversation  Coordination/Cerebellar Finger to Nose: Dysmetric on both, ataxic on L Heel to [place]: Patient unable to go fast and get full ROM with both LEs. Rapid alternating movements: Ataxic on L Finger Opposition: Unable to touch thumb to all fingers on L hand Pronator Drift: Mildly positive on L   FUNCTIONAL OUTCOME MEASURES   Results Comments  BERG 41/56 <36/56 (100% risk for falls), 37-45 (80% risk for falls); 46-51 (>50% risk for falls); 52-55 (lower risk <25% of falls)  TUG 9.09 seconds WFL  5TSTS NT Unable to perform without UE support  10 Meter Gait Speed Self-selected: 11.76s = 0.85 m/s; Fastest: 6.64 s = 1.5 m/s Self-selected pace is below normative values for full community ambulation; <1.0 m/s indicates increased risk for falls; limited community ambulator  ABC Scale 51.25% Below cut-off  FOTO 50 Predicted improvement 55    POSTURAL CONTROL TESTS   Modified Clinical Test of Sensory Interaction for Balance (CTSIB): Deferred      [facility] PT Assessment - 10/28/19 1402      Assessment     Medical Diagnosis Falls    Referring Provider (PT) Dr. Bethann Punches    Onset Date/Surgical Date 07/28/19   Approximate   Next MD Visit Not reported    Prior Therapy Yes, for dizziness/balance in 2018      Precautions   Precautions Fall      Restrictions   Weight Bearing Restrictions No      Balance Screen   Has the patient fallen in the past 6 months Yes    How many times? At least 3 times    Is the patient reluctant to leave their home because of a fear of falling?  No   Prefers another person with him at all times  [place] residence    Living Arrangements Parent    Available Help at Discharge Family    Type of [place] Access Level entry    [place] Two level    Alternate Level Stairs-Number of Steps 1 flight    Alternate [place] bars - tub/shower;Shower seat - built in;Walker - standard;Toilet riser      Prior Function   Level of Independence Needs assistance with homemaking      Cognition   Overall Cognitive Status History of cognitive impairments - at baseline    Attention Alternating    Alternating Attention Impaired      Transfers   Five time sit to stand comments  Unable to stand up without hands from a normal height chair      Standardized Balance Assessment   Standardized Balance Assessment Berg Balance Test      Berg Balance Test   Sit to Stand Able to stand  independently using hands    Standing Unsupported Able to stand safely 2 minutes    Sitting with Back Unsupported but Feet Supported on Floor or Stool Able to sit safely and securely 2 minutes    Stand to Sit Controls descent by using hands    Transfers Able to transfer safely, definite need of hands    Standing Unsupported with Eyes Closed Able to stand 10 seconds safely    Standing Unsupported with Feet Together Able to place feet together independently and stand 1 minute safely    From Standing,  Reach Forward with Outstretched Arm Can reach forward >12 cm safely (5")    From Standing Position, Pick up Object from Floor Able to pick up shoe safely and easily    From Standing Position, Turn to Look Behind Over each Shoulder Looks behind one side only/other side shows less weight shift    Turn 360 Degrees Able to turn 360 degrees safely but slowly    Standing Unsupported, Alternately Place Feet on Step/Stool Able to complete >2 steps/needs minimal assist    Standing Unsupported, One Foot in Front Able to plae foot ahead of the other independently and hold 30 seconds    Standing on One Leg Unable to try or needs assist to prevent fall    Total Score 41               Objective measurements completed on examination: See above findings.        TREATMENT   Ther-ex  HEP: Performed HEP with patient. Sit to Stand x 10 reps Standing March with Counter Support x 10 reps Romberg Stance x 30 secs hold         PT Education - 10/27/19 1610    Education Details HEP given and performed    Person(s) Educated Patient    Methods Explanation;Handout    Comprehension Verbalized understanding            PT Short Term Goals - 10/27/19 1900      PT SHORT TERM GOAL #1   Title Pt will be independent with HEP in order to improve strength and balance in order to decrease fall risk and improve function at home and work.    Baseline 10/27/19: HEP given    Time 4    Period Weeks    Status New    Target Date 11/24/19      PT SHORT TERM GOAL #2  Title Patient will be able to perform sit to stands without UE support in order to demonstrate increased functional strength of LEs.    Baseline 10/27/19: Patient unable to perform sit to stands without UE support or higher chair    Time 4    Period Weeks    Status New    Target Date 11/24/19             PT Long Term Goals - 10/27/19 1902      PT LONG TERM GOAL #1   Title Pt will improve BERG by at least 3 points in order to  demonstrate clinically significant improvement in balance.    Baseline 10/27/19: 41/56    Time 8    Period Weeks    Status New    Target Date 12/22/19      PT LONG TERM GOAL #2   Title Pt will perform 5TSTS in less than 12 seconds in order to decrease risk for falls.    Baseline 10/27/19: Unable to perform due to UE support    Time 8    Period Weeks    Status New    Target Date 12/22/19      PT LONG TERM GOAL #3   Title Pt will improve ABC by at least 13% in order to demonstrate clinically significant improvement in balance confidence.    Baseline 10/27/19: 51.25%    Time 8    Period Weeks    Status New    Target Date 12/22/19      PT LONG TERM GOAL #4   Title Patient will increase FOTO score to equal to or greater than 55 to demonstrate statistically significant improvement in mobility and quality of life    Baseline 10/27/19: 50    Time 8    Period Weeks    Status New    Target Date 12/22/19      PT LONG TERM GOAL #5   Title Patient will improve 10MWT self-selected pace to 1.88m/s in order to be considered a full community ambulator.    Baseline 10/27/19: self-selected: 0.85 m/s, fastest: 1.42m/s    Time 8    Period Weeks    Status New    Target Date 12/22/19                  Plan - 10/27/19 1922    Clinical Impression Statement Pt is a pleasant 44 year-old male referred for difficulty with balance. PT examination reveals deficits with L strength and coordination. He hs some residual L sided weakness from his TBI and some cognitive deficits. He has an ataxic gait with L foot drop and wide base of support as well as significantly externally rotated LLE. He has no activation of tibialis anterior on his LLE. Patient scored a 41/56 on the BERG, indicating that he is a significant fall risk. His TUG was [facility] as well as his fastest 10MWT. Patient scored a 0.29m/s on his selected pace for 10MWT, indicating that he is not a full community ambulator. He scored a 50 on the FOTO with  prediced improvement to 55. Patient unable to perform 5TSTS as he depends on UE support to stand up. Pt presents with deficits in strength, gait and balance. Pt will benefit from skilled PT services to address deficits in balance and decrease risk for future falls.    Personal Factors and Comorbidities Comorbidity 3+;Time since onset of injury/illness/exacerbation;Past/Current Experience    Comorbidities TBI, GERD, cognitive deficits, back pain  Examination-Activity Limitations Locomotion Level;Reach Overhead;Bathing;Stairs;Caring for Others    Examination-Participation Restrictions Driving;Community Activity;Meal Prep;Occupation;Volunteer;Yard Work    [employer]chant [employer] officer Evolving/Moderate complexity    Clinical Decision Making Moderate    Rehab Potential Fair    PT Frequency 2x / week    PT Duration 8 weeks    PT Treatment/Interventions ADLs/Self Care Home Management;Aquatic Therapy;Canalith Repostioning;Gait training;Stair training;Functional mobility training;Therapeutic activities;Balance training;Neuromuscular re-education;Therapeutic exercise;Patient/family education;Dry needling;Vestibular;Spinal Manipulations;Joint Manipulations;Electrical Stimulation;Cryotherapy;Passive range of motion;Traction;Moist Heat;Ultrasound;Cognitive remediation;Manual techniques    PT Next Visit Plan Continue with strengthening and balance exercises, progress HEP    PT Home Exercise Plan Medbridge Code: [email or web address]    Consulted and Agree with Plan of Care Patient           Patient will benefit from skilled therapeutic intervention in order to improve the following deficits and impairments:  Abnormal gait, Decreased strength, Dizziness, Difficulty walking, Decreased balance, Decreased coordination, Pain  Visit Diagnosis: Unsteadiness on feet - Plan: PT plan of care cert/re-cert     Problem List Patient Active Problem List   Diagnosis Date Noted   Lumbar facet arthropathy 02/24/2018    Chronic pain syndrome 02/24/2018   Ataxia 08/15/2016   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 I have personally read, edited and approve of the note as written.   Noemi Chapel, SPT [facility] Grout PT, DPT, GCS  Huprich,Jason 10/28/2019, 2:25 PM  [place] MAIN [facility] SERVICES [address] [place], [place], [address] Phone: [phone]   Fax:  [phone]  Name: Timothy Farley MRN: [id number] Date of Birth: 09-22-1975

## 2019-11-03 ENCOUNTER — Ambulatory Visit: Payer: Medicare Other

## 2019-11-03 ENCOUNTER — Other Ambulatory Visit: Payer: Self-pay

## 2019-11-03 DIAGNOSIS — R2681 Unsteadiness on feet: Secondary | ICD-10-CM | POA: Diagnosis not present

## 2019-11-03 NOTE — Therapy (Signed)
[place] MAIN [facility] SERVICES [address] [place], [place], [address] Phone: [phone]   Fax:  [phone]  Physical Therapy Treatment  Patient Details  Name: Timothy Farley MRN: [id number] Date of Birth: 29-Aug-1975 Referring Provider (PT): Dr. Bethann Punches   Encounter Date: 11/03/2019   PT End of Session - 11/03/19 1556    Visit Number 2    Number of Visits 17    Date for PT Re-Evaluation 12/22/19    Authorization Type Eval: 10/27/19    PT Start Time 1448    PT Stop Time 1530    PT Time Calculation (min) 42 min    Equipment Utilized During Treatment Gait belt    Activity Tolerance Patient tolerated treatment well    Behavior During Therapy Impulsive           Past Medical History:  Diagnosis Date  . Arthritis   . GERD (gastroesophageal reflux disease)   . Left foot drop   . Pleurisy   . Short-term memory loss   . Subdural hematoma ([place])   . TBI (traumatic brain injury) ([place]) 1994    Past Surgical History:  Procedure Laterality Date  . BRAIN SURGERY    . brain surgery x3    . ESOPHAGOGASTRODUODENOSCOPY (EGD) WITH PROPOFOL N/A 02/19/2018   Procedure: ESOPHAGOGASTRODUODENOSCOPY (EGD) WITH PROPOFOL;  Surgeon: Toledo, Benay Pike, MD;  Location: [facility] ENDOSCOPY;  Service: Gastroenterology;  Laterality: N/A;  . GASTROSTOMY W/ FEEDING TUBE    . trachostemy      There were no vitals filed for this visit.   Subjective Assessment - 11/03/19 1558    Subjective Patient reports doing good today. He notes he had a dizzy spell the other day when walking after sitting in his recliner. It went away after a few seconds and he did not fall. No other questions/concerns at this time.    Pertinent History Patient is a 44 year old male with history of falling/unbalance. He notes that the room is always spinning and he has to hold onto his mom's shirt/arm when walking. He has had 3 falls in the last few months. One of which was taking a step  backwards, another forward, and the third his L foot got stuck and he fell forward. He notes that his L knee buckles. He had a TBI when he was younger, and is L side was affected most. He has back pain that goes down his L leg. He used to get Cortizone shots before his insurance stopped paying for them. His goal is to walk down the street confidently without holding on to anything. He notes that he does not want to walk with a cane or walker.    Limitations House hold activities;Walking    Patient Stated Goals Walk down the street confidently without holding on to anything    Currently in Pain? No/denies    Pain Onset --             TREATMENT   Therapeutic Exercises  Sit to stands on normal chair with added airex pad x 8 trials without UE support, patient unable without UE support; Sit to stands on raised table x 5 reps without UE support, patient cued for slow and controlled back to the table.  Standing exercises with AW #3 with BUE support Hip flexion with no AW x 20 each LE (reviewed for HEP); Hip extension x 20 each LE; Hip abductions x 20 each LE;  Balancing on airex pad 2 x  30 secs, with WBOS, patient feels unsteady/unbalanced but there is no sway; Precor Leg Press 55# x 20 reps; 70# x 30 reps, will look into increasing weight next time; Rockerboard left and right shifts x 10 each direction, with fading UE support, pt notes shifting to his R side was harder;   Pt educated throughout session about proper posture and technique with exercises. Improved exercise technique, movement at target joints, use of target muscles after min to mod verbal, visual, tactile cues.      Pt demonstrates good motivation during session. Session focused on strengthening and balancing exercises for LE musculature. He is still having trouble with sit to stands but is able to perform with raised table. During balance exercises, patient looks steady but patient reports feeling very unbalanced. Verbal  cueing utilized for proper form of all exercises. His L LE extremity was externally rotated more today than initial evaluation. Will look more into it next time. Pt will benefit from PT services to address deficits in strength, balance, and mobility in order to return to full function at home.       PT Short Term Goals - 10/27/19 1900      PT SHORT TERM GOAL #1   Title Pt will be independent with HEP in order to improve strength and balance in order to decrease fall risk and improve function at home and work.    Baseline 10/27/19: HEP given    Time 4    Period Weeks    Status New    Target Date 11/24/19      PT SHORT TERM GOAL #2   Title Patient will be able to perform sit to stands without UE support in order to demonstrate increased functional strength of LEs.    Baseline 10/27/19: Patient unable to perform sit to stands without UE support or higher chair    Time 4    Period Weeks    Status New    Target Date 11/24/19             PT Long Term Goals - 10/27/19 1902      PT LONG TERM GOAL #1   Title Pt will improve BERG by at least 3 points in order to demonstrate clinically significant improvement in balance.    Baseline 10/27/19: 41/56    Time 8    Period Weeks    Status New    Target Date 12/22/19      PT LONG TERM GOAL #2   Title Pt will perform 5TSTS in less than 12 seconds in order to decrease risk for falls.    Baseline 10/27/19: Unable to perform due to UE support    Time 8    Period Weeks    Status New    Target Date 12/22/19      PT LONG TERM GOAL #3   Title Pt will improve ABC by at least 13% in order to demonstrate clinically significant improvement in balance confidence.    Baseline 10/27/19: 51.25%    Time 8    Period Weeks    Status New    Target Date 12/22/19      PT LONG TERM GOAL #4   Title Patient will increase FOTO score to equal to or greater than 55 to demonstrate statistically significant improvement in mobility and quality of life    Baseline  10/27/19: 50    Time 8    Period Weeks    Status New    Target Date 12/22/19  PT LONG TERM GOAL #5   Title Patient will improve 10MWT self-selected pace to 1.70m/s in order to be considered a full community ambulator.    Baseline 10/27/19: self-selected: 0.85 m/s, fastest: 1.73m/s    Time 8    Period Weeks    Status New    Target Date 12/22/19                 Plan - 11/03/19 1556    Clinical Impression Statement Pt demonstrates good motivation during session. Session focused on strengthening and balancing exercises for LE musculature. He is still having trouble with sit to stands but is able to perform with raised table. During balance exercises, patient looks steady but patient reports feeling very unbalanced. Verbal cueing utilized for proper form of all exercises. His L LE extremity was externally rotated more today than initial evaluation. Will look more into it next time. Pt will benefit from PT services to address deficits in strength, balance, and mobility in order to return to full function at home.    Personal Factors and Comorbidities Comorbidity 3+;Time since onset of injury/illness/exacerbation;Past/Current Experience    Comorbidities TBI, GERD, cognitive deficits, back pain    Examination-Activity Limitations Locomotion Level;Reach Overhead;Bathing;Stairs;Caring for Others    Examination-Participation Restrictions Driving;Community Activity;Meal Prep;Occupation;Volunteer;Yard Work    [employer] officer Evolving/Moderate complexity    Rehab Potential Fair    PT Frequency 2x / week    PT Duration 8 weeks    PT Treatment/Interventions ADLs/Self Care Home Management;Aquatic Therapy;Canalith Repostioning;Gait training;Stair training;Functional mobility training;Therapeutic activities;Balance training;Neuromuscular re-education;Therapeutic exercise;Patient/family education;Dry needling;Vestibular;Spinal Manipulations;Joint Manipulations;Electrical  Stimulation;Cryotherapy;Passive range of motion;Traction;Moist Heat;Ultrasound;Cognitive remediation;Manual techniques    PT Next Visit Plan Look into ER of L LE; ontinue with strengthening and balance exercises, progress HEP    PT Home Exercise Plan Medbridge Code: [email or web address]    Consulted and Agree with Plan of Care Patient           Patient will benefit from skilled therapeutic intervention in order to improve the following deficits and impairments:  Abnormal gait, Decreased strength, Dizziness, Difficulty walking, Decreased balance, Decreased coordination, Pain  Visit Diagnosis: Unsteadiness on feet     Problem List Patient Active Problem List   Diagnosis Date Noted  . Lumbar facet arthropathy 02/24/2018  . Chronic pain syndrome 02/24/2018  . 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 History of traumatic brain injury 06/10/2014  . Osteoarthritis of right knee 06/10/2014    This entire session was performed under direct supervision and direction of a licensed therapist/therapist assistant . I have personally read, edited and approve of the note as written.   Noemi Chapel, SPT [facility] Grout PT, DPT, GCS  Huprich,Jason 11/04/2019, 2:59 PM  [place] MAIN [facility] SERVICES [address] [place], [place], [address] Phone: [phone]   Fax:  [phone]  Name: Timothy Farley MRN: [id number] Date of Birth: 05-08-1975

## 2019-11-05 ENCOUNTER — Other Ambulatory Visit: Payer: Self-pay

## 2019-11-05 ENCOUNTER — Ambulatory Visit: Payer: Medicare Other

## 2019-11-05 DIAGNOSIS — R2681 Unsteadiness on feet: Secondary | ICD-10-CM

## 2019-11-05 NOTE — Therapy (Signed)
[place] MA[facility] SERVICES [address] [place], [place], [address] Phone: [phone]   Fax:  [phone]  Physical Therapy Treatment  Patient Details  Name: Timothy Farley MRN: [id number] Date of Birth: 1976/02/03 Referring Provider (PT): Dr. Bethann Punches   Encounter Date: 11/05/2019   PT End of Session - 11/05/19 1551    Visit Number 3    Number of Visits 17    Date for PT Re-Evaluation 12/22/19    Authorization Type Eval: 10/27/19    PT Start Time 1600    PT Stop Time 1645    PT Time Calculation (min) 45 min    Equipment Utilized During Treatment Gait belt    Activity Tolerance Patient tolerated treatment well    Behavior During Therapy Impulsive           Past Medical History:  Diagnosis Date  . Arthritis   . GERD (gastroesophageal reflux disease)   . Left foot drop   . Pleurisy   . Short-term memory loss   . Subdural hematoma ([place])   . TBI (traumatic brain injury) ([place]) 1994    Past Surgical History:  Procedure Laterality Date  . BRAIN SURGERY    . brain surgery x3    . ESOPHAGOGASTRODUODENOSCOPY (EGD) WITH PROPOFOL N/A 02/19/2018   Procedure: ESOPHAGOGASTRODUODENOSCOPY (EGD) WITH PROPOFOL;  Surgeon: Toledo, Benay Pike, MD;  Location: [facility] ENDOSCOPY;  Service: Gastroenterology;  Laterality: N/A;  . GASTROSTOMY W/ FEEDING TUBE    . trachostemy      There were no vitals filed for this visit.   Subjective Assessment - 11/05/19 1550    Subjective Patient reports doing good today. No updates since last session. He denies any pain. No other questions/concerns at this time.    Pertinent History Patient is a 44 year old male with history of falling/unbalance. He notes that the room is always spinning and he has to hold onto his mom's shirt/arm when walking. He has had 3 falls in the last few months. One of which was taking a step backwards, another forward, and the third his L foot got stuck and he fell forward. He notes that  his L knee buckles. He had a TBI when he was younger, and is L side was affected most. He has back pain that goes down his L leg. He used to get Cortizone shots before his insurance stopped paying for them. His goal is to walk down the street confidently without holding on to anything. He notes that he does not want to walk with a cane or walker.    Limitations House hold activities;Walking    Patient Stated Goals Walk down the street confidently without holding on to anything    Currently in Pain? No/denies            TREATMENT  Therapeutic Exercises  Nu-step Seat 14, arms 11, L1-2, during history taking x 5 mins and for cardiovascular endurance, adjusting interval throughout;  Supine: Hip flexion stretch x 15 secs BLE Hip IR stretch x 30 secs BLE Hip ER stretch x 30 secs BLE   Performed in sitting: Hip IR: R: no ROM, MMT: 1/5 ; L: ROM WFL, MMT: 4-/5 Hip ER: R: ROM WFL, MMT: 4/5; L: ROM WFL, MMT 4/5  Performed in sidelying: MMT Hip abduction: R: 4+/5; L: 4-/5 Hip adduction: R: 4/5; L: 3+/5  Hip extension not tested as patient unable to lay prone.  VOR x 1 horizontal x 60 secs in sitting, patient  reports 3-4/10 dizziness. Gave exercise for HEP;  Performed in // bars: Tapping stepping stones x multiple bouts with SPT calling out what foot to touch what color stones, patient able to balance tapping with R foot, but unable to tap with L foot without UE support, patient cued to flex hip and leg, CGA assist, series of 1 stepping stone, Patient did not lose balance; Balancing on airex pad 2 x 30 secs each with WBOS and NBOS, with no UE support, patient feels unsteady/unbalanced but there is no sway; Stepping over a 6" hurdle step with BUE to SUE support x 10 with R LE, BUE support x 10 with L LE, patient requires UE support with exercise, cued for hip flexion to clear step; Steps up on 6" step with BUE support x 10 each foot, patient unable to perform without UE support;   Pt  educated throughout session about proper posture and technique with exercises. Improved exercise technique, movement at target joints, use of target muscles after min to mod verbal, visual, tactile cues.       Pt demonstrates good motivation during session. Session focused on strengthening and balancing exercises for LE musculature. Patient has decreased R hip IR ROM and L adductors, abductors, and internal rotators are weak. He has had no hip/back surgery and his L foot has been ER since the accident. Performed VOR x 1 horizontal to help habituate dizziness and gave exercise for HEP. During balance exercises, patient looks steady but patient reports feeling very unbalanced. Continue working on strengthening LE musculature and increase balance. Verbal cueing utilized for proper form of all exercises. Pt encouraged to perform HEP and follow-up as scheduled. Pt will benefit from PT services to address deficits in strength, balance, and mobility in order to return to full function at home.       PT Short Term Goals - 10/27/19 1900      PT SHORT TERM GOAL #1   Title Pt will be independent with HEP in order to improve strength and balance in order to decrease fall risk and improve function at home and work.    Baseline 10/27/19: HEP given    Time 4    Period Weeks    Status New    Target Date 11/24/19      PT SHORT TERM GOAL #2   Title Patient will be able to perform sit to stands without UE support in order to demonstrate increased functional strength of LEs.    Baseline 10/27/19: Patient unable to perform sit to stands without UE support or higher chair    Time 4    Period Weeks    Status New    Target Date 11/24/19             PT Long Term Goals - 10/27/19 1902      PT LONG TERM GOAL #1   Title Pt will improve BERG by at least 3 points in order to demonstrate clinically significant improvement in balance.    Baseline 10/27/19: 41/56    Time 8    Period Weeks    Status New    Target  Date 12/22/19      PT LONG TERM GOAL #2   Title Pt will perform 5TSTS in less than 12 seconds in order to decrease risk for falls.    Baseline 10/27/19: Unable to perform due to UE support    Time 8    Period Weeks    Status New    Target  Date 12/22/19      PT LONG TERM GOAL #3   Title Pt will improve ABC by at least 13% in order to demonstrate clinically significant improvement in balance confidence.    Baseline 10/27/19: 51.25%    Time 8    Period Weeks    Status New    Target Date 12/22/19      PT LONG TERM GOAL #4   Title Patient will increase FOTO score to equal to or greater than 55 to demonstrate statistically significant improvement in mobility and quality of life    Baseline 10/27/19: 50    Time 8    Period Weeks    Status New    Target Date 12/22/19      PT LONG TERM GOAL #5   Title Patient will improve 10MWT self-selected pace to 1.48m/s in order to be considered a full community ambulator.    Baseline 10/27/19: self-selected: 0.85 m/s, fastest: 1.56m/s    Time 8    Period Weeks    Status New    Target Date 12/22/19                 Plan - 11/05/19 1551    Clinical Impression Statement Pt demonstrates good motivation during session. Session focused on strengthening and balancing exercises for LE musculature. Patient has decreased R hip IR ROM and L adductors, abductors, and internal rotators are weak. He has had no hip/back surgery and his L foot has been ER since the accident. Performed VOR x 1 horizontal to help habituate dizziness and gave exercise for HEP. During balance exercises, patient looks steady but patient reports feeling very unbalanced. Continue working on strengthening LE musculature and increase balance. Verbal cueing utilized for proper form of all exercises. Pt encouraged to perform HEP and follow-up as scheduled. Pt will benefit from PT services to address deficits in strength, balance, and mobility in order to return to full function at home.     Personal Factors and Comorbidities Comorbidity 3+;Time since onset of injury/illness/exacerbation;Past/Current Experience    Comorbidities TBI, GERD, cognitive deficits, back pain    Examination-Activity Limitations Locomotion Level;Reach Overhead;Bathing;Stairs;Caring for Others    Examination-Participation Restrictions Driving;Community Activity;Meal Prep;Occupation;Volunteer;Yard Work    [employer] officer Evolving/Moderate complexity    Rehab Potential Fair    PT Frequency 2x / week    PT Duration 8 weeks    PT Treatment/Interventions ADLs/Self Care Home Management;Aquatic Therapy;Canalith Repostioning;Gait training;Stair training;Functional mobility training;Therapeutic activities;Balance training;Neuromuscular re-education;Therapeutic exercise;Patient/family education;Dry needling;Vestibular;Spinal Manipulations;Joint Manipulations;Electrical Stimulation;Cryotherapy;Passive range of motion;Traction;Moist Heat;Ultrasound;Cognitive remediation;Manual techniques    PT Next Visit Plan Look into ER of L LE; ontinue with strengthening and balance exercises, progress HEP    PT Home Exercise Plan Medbridge Code: [email or web address]    Consulted and Agree with Plan of Care Patient           Patient will benefit from skilled therapeutic intervention in order to improve the following deficits and impairments:  Abnormal gait, Decreased strength, Dizziness, Difficulty walking, Decreased balance, Decreased coordination, Pain  Visit Diagnosis: Unsteadiness on feet     Problem List Patient Active Problem List   Diagnosis Date Noted  . Lumbar facet arthropathy 02/24/2018  . Chronic pain syndrome 02/24/2018  . 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 History of traumatic brain injury 06/10/2014  . Osteoarthritis of right knee 06/10/2014    This entire session was performed under direct supervision and direction of a licensed  therapist/therapist assistant . I have personally read, edited and approve of the note as written.   Noemi Chapel, SPT Phillips Grout PT, DPT, GCS  Huprich,Jason 11/06/2019, 10:36 AM  [place] MAIN [facility] SERVICES [address] [place], [place], [address] Phone: [phone]   Fax:  [phone]  Name: Timothy Farley MRN: [id number] Date of Birth: 05/02/1975

## 2019-11-05 NOTE — Patient Instructions (Signed)
Access Code: [email or web address] URL: [email or web address] Date: 11/05/2019 Prepared by: Roxana Hires  Exercises Seated Gaze Stabilization with Head Rotation - 4 x daily - 7 x weekly - 3 reps - 60 seconds hold

## 2019-11-10 ENCOUNTER — Ambulatory Visit: Payer: Medicare Other

## 2019-11-10 DIAGNOSIS — R2681 Unsteadiness on feet: Secondary | ICD-10-CM

## 2019-11-10 NOTE — Therapy (Signed)
[place] MAIN [facility] SERVICES [address] [place], [place], [address] Phone: [phone]   Fax:  [phone]  Physical Therapy Treatment  Patient Details  Name: Timothy Farley MRN: [id number] Date of Birth: 1975/05/22 Referring Provider (PT): Dr. Bethann Punches   Encounter Date: 11/10/2019   PT End of Session - 11/10/19 1342    Visit Number 4    Number of Visits 17    Date for PT Re-Evaluation 12/22/19    Authorization Type Eval: 10/27/19    PT Start Time 1340    PT Stop Time 1425    PT Time Calculation (min) 45 min    Equipment Utilized During Treatment Gait belt    Activity Tolerance Patient tolerated treatment well    Behavior During Therapy Impulsive           Past Medical History:  Diagnosis Date  . Arthritis   . GERD (gastroesophageal reflux disease)   . Left foot drop   . Pleurisy   . Short-term memory loss   . Subdural hematoma ([place])   . TBI (traumatic brain injury) ([place]) 1994    Past Surgical History:  Procedure Laterality Date  . BRAIN SURGERY    . brain surgery x3    . ESOPHAGOGASTRODUODENOSCOPY (EGD) WITH PROPOFOL N/A 02/19/2018   Procedure: ESOPHAGOGASTRODUODENOSCOPY (EGD) WITH PROPOFOL;  Surgeon: Toledo, Benay Pike, MD;  Location: [facility] ENDOSCOPY;  Service: Gastroenterology;  Laterality: N/A;  . GASTROSTOMY W/ FEEDING TUBE    . trachostemy      There were no vitals filed for this visit.   Subjective Assessment - 11/10/19 1341    Subjective Patient reports doing good today. No updates since last session. He reports stumbles but no falls since lsat therapy session. He is complaining of 5/10 chronic low back pain upon arrival today. No other questions/concerns at this time.    Pertinent History Patient is a 44 year old male with history of falling/unbalance. He notes that the room is always spinning and he has to hold onto his mom's shirt/arm when walking. He has had 3 falls in the last few months. One of which was  taking a step backwards, another forward, and the third his L foot got stuck and he fell forward. He notes that his L knee buckles. He had a TBI when he was younger, and is L side was affected most. He has back pain that goes down his L leg. He used to get Cortizone shots before his insurance stopped paying for them. His goal is to walk down the street confidently without holding on to anything. He notes that he does not want to walk with a cane or walker.    Limitations House hold activities;Walking    Patient Stated Goals Walk down the street confidently without holding on to anything    Currently in Pain? Yes    Pain Score 5     Pain Location Back    Pain Orientation Lower    Pain Descriptors / Indicators Aching    Pain Type Chronic pain    Pain Onset More than a month ago    Pain Frequency Intermittent             TREATMENT   Therapeutic Exercises NuStep L2-4 (seat 14, arms 11), during history taking for warm-up x 5 mins and for cardiovascular endurance, adjusting interval throughout (4 minutes unbilled);  Precor bilateral leg press 70# x 30 reps, 85# x 30, 100# x 20 ("medium" difficulty) will  continue to increase weight next visit'  Sit to stands from elevated mat table with BUE assistance 2 x 10 reps, patient cued for slow eccentric control back to the table as well as cued for increased anterior weight shifting during sit to stand.  Standing squats with BUE support on treadmill with gluts 1-2" from seat x 10;    Neuromuscular Re-education  VOR x 1 horizontal 3 x 30 secs in standing WBOS, patient reports 5/10 dizziness after first rep, 6/10 after second and third reps;   Alternating 6" step taps with faded UE support 2-1, practiced multiple times without UE support, pt has significant anxiety regarding falling but is eventually able to perform multiple times on each leg x 10 BLE;  Airex WBOS eyes open static balance x 30s;  Airex WBOS eyes open horizontal and vertical  head turns x 30s each;   Pt educated throughout session about proper posture and technique with exercises. Improved exercise technique, movement at target joints, use of target muscles after min to mod verbal, visual, tactile cues.    Pt demonstrates good motivation during session. Session focused on strengtheningand balancingexercises. Continued with VOR x 1 horizontal but progressed to standing today. Pt has increased anxiety regarding dizziness during VOR x 1. Progressed VOR x 1 horizontal to standing and pt reports he feels confident with his safety at home. He was able to progress resistance on the BLE leg press today and will continue to advance in future sessions. Verbal cueing utilized for proper form of all exercises during session today. Pt encouraged to perform HEP and follow-up as scheduled. Pt will benefit from PT services to address deficits in strength, balance, and mobility in order to return to full function at home.                           PT Short Term Goals - 10/27/19 1900      PT SHORT TERM GOAL #1   Title Pt will be independent with HEP in order to improve strength and balance in order to decrease fall risk and improve function at home and work.    Baseline 10/27/19: HEP given    Time 4    Period Weeks    Status New    Target Date 11/24/19      PT SHORT TERM GOAL #2   Title Patient will be able to perform sit to stands without UE support in order to demonstrate increased functional strength of LEs.    Baseline 10/27/19: Patient unable to perform sit to stands without UE support or higher chair    Time 4    Period Weeks    Status New    Target Date 11/24/19             PT Long Term Goals - 10/27/19 1902      PT LONG TERM GOAL #1   Title Pt will improve BERG by at least 3 points in order to demonstrate clinically significant improvement in balance.    Baseline 10/27/19: 41/56    Time 8    Period Weeks    Status New    Target  Date 12/22/19      PT LONG TERM GOAL #2   Title Pt will perform 5TSTS in less than 12 seconds in order to decrease risk for falls.    Baseline 10/27/19: Unable to perform due to UE support    Time 8    Period Weeks  Status New    Target Date 12/22/19      PT LONG TERM GOAL #3   Title Pt will improve ABC by at least 13% in order to demonstrate clinically significant improvement in balance confidence.    Baseline 10/27/19: 51.25%    Time 8    Period Weeks    Status New    Target Date 12/22/19      PT LONG TERM GOAL #4   Title Patient will increase FOTO score to equal to or greater than 55 to demonstrate statistically significant improvement in mobility and quality of life    Baseline 10/27/19: 50    Time 8    Period Weeks    Status New    Target Date 12/22/19      PT LONG TERM GOAL #5   Title Patient will improve 10MWT self-selected pace to 1.86m/s in order to be considered a full community ambulator.    Baseline 10/27/19: self-selected: 0.85 m/s, fastest: 1.43m/s    Time 8    Period Weeks    Status New    Target Date 12/22/19                 Plan - 11/10/19 1343    Clinical Impression Statement Pt demonstrates good motivation during session. Session focused on strengthening and balancing exercises. Continued with VOR x 1 horizontal but progressed to standing today. Pt has increased anxiety regarding dizziness during VOR x 1. Progressed VOR x 1 horizontal to standing and pt reports he feels confident with his safety at home. He was able to progress resistance on the BLE leg press today and will continue to advance in future sessions. Verbal cueing utilized for proper form of all exercises during session today. Pt encouraged to perform HEP and follow-up as scheduled. Pt will benefit from PT services to address deficits in strength, balance, and mobility in order to return to full function at home.    Personal Factors and Comorbidities Comorbidity 3+;Time since onset of  injury/illness/exacerbation;Past/Current Experience    Comorbidities TBI, GERD, cognitive deficits, back pain    Examination-Activity Limitations Locomotion Level;Reach Overhead;Bathing;Stairs;Caring for Others    Examination-Participation Restrictions Driving;Community Activity;Meal Prep;Occupation;Volunteer;Yard Work    [employer] officer Evolving/Moderate complexity    Rehab Potential Fair    PT Frequency 2x / week    PT Duration 8 weeks    PT Treatment/Interventions ADLs/Self Care Home Management;Aquatic Therapy;Canalith Repostioning;Gait training;Stair training;Functional mobility training;Therapeutic activities;Balance training;Neuromuscular re-education;Therapeutic exercise;Patient/family education;Dry needling;Vestibular;Spinal Manipulations;Joint Manipulations;Electrical Stimulation;Cryotherapy;Passive range of motion;Traction;Moist Heat;Ultrasound;Cognitive remediation;Manual techniques    PT Next Visit Plan Look into ER of L LE; ontinue with strengthening and balance exercises, progress HEP    PT Home Exercise Plan Medbridge Code: [email or web address]    Consulted and Agree with Plan of Care Patient           Patient will benefit from skilled therapeutic intervention in order to improve the following deficits and impairments:  Abnormal gait, Decreased strength, Dizziness, Difficulty walking, Decreased balance, Decreased coordination, Pain  Visit Diagnosis: Unsteadiness on feet     Problem List Patient Active Problem List   Diagnosis Date Noted  . Lumbar facet arthropathy 02/24/2018  . Chronic pain syndrome 02/24/2018  . 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 History of traumatic brain injury 06/10/2014  . Osteoarthritis of right knee 06/10/2014    Lyndel Safe Adrianah Prophete PT, DPT, GCS  Hersel Mcmeen 11/10/2019, 3:17 PM  [place] [facility]  [place] MAIN [facility] SERVICES [place], [place], [address] Phone: [phone]   Fax:  [phone]  Name: Darien Kading MRN: [id number] Date of Birth: 05-21-1975

## 2019-11-12 ENCOUNTER — Other Ambulatory Visit: Payer: Self-pay

## 2019-11-12 ENCOUNTER — Ambulatory Visit: Payer: Medicare Other | Attending: Internal Medicine

## 2019-11-12 DIAGNOSIS — M545 Low back pain: Secondary | ICD-10-CM | POA: Insufficient documentation

## 2019-11-12 DIAGNOSIS — G8929 Other chronic pain: Secondary | ICD-10-CM | POA: Diagnosis present

## 2019-11-12 DIAGNOSIS — R2681 Unsteadiness on feet: Secondary | ICD-10-CM

## 2019-11-12 NOTE — Therapy (Signed)
[place] MAIN [facility] SERVICES [address] [place], [place], [address] Phone: [phone]   Fax:  [phone]  Physical Therapy Treatment  Patient Details  Name: Timothy Farley MRN: [id number] Date of Birth: October 21, 1975 Referring Provider (PT): Dr. Bethann Punches   Encounter Date: 11/12/2019   PT End of Session - 11/12/19 1506    Visit Number 5    Number of Visits 17    Date for PT Re-Evaluation 12/22/19    Authorization Type Eval: 10/27/19    PT Start Time 1514    PT Stop Time 1559    PT Time Calculation (min) 45 min    Equipment Utilized During Treatment Gait belt    Activity Tolerance Patient tolerated treatment well    Behavior During Therapy Impulsive           Past Medical History:  Diagnosis Date  . Arthritis   . GERD (gastroesophageal reflux disease)   . Left foot drop   . Pleurisy   . Short-term memory loss   . Subdural hematoma ([place])   . TBI (traumatic brain injury) ([name]) 1994    Past Surgical History:  Procedure Laterality Date  . BRAIN SURGERY    . brain surgery x3    . ESOPHAGOGASTRODUODENOSCOPY (EGD) WITH PROPOFOL N/A 02/19/2018   Procedure: ESOPHAGOGASTRODUODENOSCOPY (EGD) WITH PROPOFOL;  Surgeon: Toledo, Benay Pike, MD;  Location: [facility] ENDOSCOPY;  Service: Gastroenterology;  Laterality: N/A;  . GASTROSTOMY W/ FEEDING TUBE    . trachostemy      There were no vitals filed for this visit.   Subjective Assessment - 11/12/19 1515    Subjective Patient reports he was sore after last session. Has been having his average back pain levels. Reports no falls or LOB since last session.    Pertinent History Patient is a 44 year old male with history of falling/unbalance. He notes that the room is always spinning and he has to hold onto his mom's shirt/arm when walking. He has had 3 falls in the last few months. One of which was taking a step backwards, another forward, and the third his L foot got stuck and he fell forward. He  notes that his L knee buckles. He had a TBI when he was younger, and is L side was affected most. He has back pain that goes down his L leg. He used to get Cortizone shots before his insurance stopped paying for them. His goal is to walk down the street confidently without holding on to anything. He notes that he does not want to walk with a cane or walker.    Limitations House hold activities;Walking    Patient Stated Goals Walk down the street confidently without holding on to anything    Currently in Pain? Yes    Pain Score 6     Pain Location Back    Pain Orientation Lower    Pain Descriptors / Indicators Aching    Pain Type Chronic pain    Pain Onset More than a month ago    Pain Frequency Intermittent              TREATMENT     Therapeutic Exercises  NuStep L2-4 (seat 14, arms 13),x 4 mins and for cardiovascular endurance, adjusting interval throughout; cues for adducting LE's   Sit to stands from elevated mat table with BUE assistance 2 x 10 reps, patient cued for slow eccentric control back to the table as well as cued for increased anterior  weight shifting during sit to stand.   Standing squats with BUE support on treadmill with gluts 1-2" from seat x 10;    TrA contraction seated against swiss ball 10x 5 second   3lb ankle weight: -lateral stepping 6x length of // bars, cues for lifting LE's -hip extension 15x each LE ; R knee hyperextension due to fatigue.   Neuromuscular Re-education     Alternating 6" step taps with faded UE support 2-1, practiced multiple times without UE support, pt has significant anxiety regarding falling but is eventually able to perform multiple times on each leg x 10 BLE;   Airex WBOS eyes open static balance x 30s;   Airex WBOS eyes open horizontal and vertical head turns x 30s each;   airex pad: static stand 60 seconds.   airex pad 4" step static hold modified tandem stance 2x30 seconds   airex pad 6' step 4" step toe taps 10x each  LE, cues for weight shift    Orange hurdle 10x each LE, BUE support   Pt educated throughout session about proper posture and technique with exercises. Improved exercise technique, movement at target joints, use of target muscles after min to mod verbal, visual, tactile cues.                          PT Education - 11/12/19 1506    Education Details exercise technique, body mechanics    Person(s) Educated Patient    Methods Explanation;Demonstration;Tactile cues;Verbal cues    Comprehension Verbalized understanding;Returned demonstration;Verbal cues required;Tactile cues required            PT Short Term Goals - 10/27/19 1900      PT SHORT TERM GOAL #1   Title Pt will be independent with HEP in order to improve strength and balance in order to decrease fall risk and improve function at home and work.    Baseline 10/27/19: HEP given    Time 4    Period Weeks    Status New    Target Date 11/24/19      PT SHORT TERM GOAL #2   Title Patient will be able to perform sit to stands without UE support in order to demonstrate increased functional strength of LEs.    Baseline 10/27/19: Patient unable to perform sit to stands without UE support or higher chair    Time 4    Period Weeks    Status New    Target Date 11/24/19             PT Long Term Goals - 10/27/19 1902      PT LONG TERM GOAL #1   Title Pt will improve BERG by at least 3 points in order to demonstrate clinically significant improvement in balance.    Baseline 10/27/19: 41/56    Time 8    Period Weeks    Status New    Target Date 12/22/19      PT LONG TERM GOAL #2   Title Pt will perform 5TSTS in less than 12 seconds in order to decrease risk for falls.    Baseline 10/27/19: Unable to perform due to UE support    Time 8    Period Weeks    Status New    Target Date 12/22/19      PT LONG TERM GOAL #3   Title Pt will improve ABC by at least 13% in order to demonstrate clinically significant  improvement in  balance confidence.    Baseline 10/27/19: 51.25%    Time 8    Period Weeks    Status New    Target Date 12/22/19      PT LONG TERM GOAL #4   Title Patient will increase FOTO score to equal to or greater than 55 to demonstrate statistically significant improvement in mobility and quality of life    Baseline 10/27/19: 50    Time 8    Period Weeks    Status New    Target Date 12/22/19      PT LONG TERM GOAL #5   Title Patient will improve 10MWT self-selected pace to 1.51m/s in order to be considered a full community ambulator.    Baseline 10/27/19: self-selected: 0.85 m/s, fastest: 1.41m/s    Time 8    Period Weeks    Status New    Target Date 12/22/19                 Plan - 11/12/19 1742    Clinical Impression Statement Patient presents with excellent motivation to physical therapy session. He is very fearful of mobility without UE support however is able to perform multiple tasks in the // bars without support and CGA only. Cueing for breathing and reduction of forward trunk lean allowed for increased stability with decreased episodes of LOB. Pt will benefit from PT services to address deficits in strength, balance, and mobility in order to return to full function at home.    Personal Factors and Comorbidities Comorbidity 3+;Time since onset of injury/illness/exacerbation;Past/Current Experience    Comorbidities TBI, GERD, cognitive deficits, back pain    Examination-Activity Limitations Locomotion Level;Reach Overhead;Bathing;Stairs;Caring for Others    Examination-Participation Restrictions Driving;Community Activity;Meal Prep;Occupation;Volunteer;Yard Work    [employer] officer Evolving/Moderate complexity    Rehab Potential Fair    PT Frequency 2x / week    PT Duration 8 weeks    PT Treatment/Interventions ADLs/Self Care Home Management;Aquatic Therapy;Canalith Repostioning;Gait training;Stair training;Functional mobility training;Therapeutic  activities;Balance training;Neuromuscular re-education;Therapeutic exercise;Patient/family education;Dry needling;Vestibular;Spinal Manipulations;Joint Manipulations;Electrical Stimulation;Cryotherapy;Passive range of motion;Traction;Moist Heat;Ultrasound;Cognitive remediation;Manual techniques    PT Next Visit Plan Look into ER of L LE; ontinue with strengthening and balance exercises, progress HEP    PT Home Exercise Plan Medbridge Code: [email or web address]    Consulted and Agree with Plan of Care Patient           Patient will benefit from skilled therapeutic intervention in order to improve the following deficits and impairments:  Abnormal gait, Decreased strength, Dizziness, Difficulty walking, Decreased balance, Decreased coordination, Pain  Visit Diagnosis: Unsteadiness on feet     Problem List Patient Active Problem List   Diagnosis Date Noted  . Lumbar facet arthropathy 02/24/2018  . Chronic pain syndrome 02/24/2018  . 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 History of traumatic brain injury 06/10/2014  . Osteoarthritis of right knee 06/10/2014   Janna Arch, PT, DPT   11/12/2019, 5:43 PM  [place] MAIN [facility] SERVICES [address] [place], [place], [address] Phone: [phone]   Fax:  [phone]  Name: Salomon Ganser MRN: [id number] Date of Birth: 17-Apr-1975

## 2019-11-16 IMAGING — RF DG UGI W/ HIGH DENSITY W/O KUB
7 of 9 series · 14 of 20 positions shown · non-contrast
Comparison: None.

CLINICAL DATA: Sharp right-sided flank pain and reflux with
increasing severity over the past several weeks. History of hiatal
hernia. History of traumatic brain injury.

EXAM:
UPPER GI SERIES WITHOUT KUB
TECHNIQUE: Routine upper GI series was performed with thin barium. Thick barium
and effervescent crystals were not administered due to the patient's
vertigo and maneuver ability difficulties.
Fluoroscopy Time:  1 minutes, 6 seconds
Radiation Exposure Index (if provided by the fluoroscopic device):
35.6 mGy
Number of Acquired Spot Images: 5+3 screen save images and 1 video
loop.

[Series 1: fluoro_barium 2fps_bw · 0.18mm/px · 1 of 1 slices shown (1 of 4)]
[im 1/1]
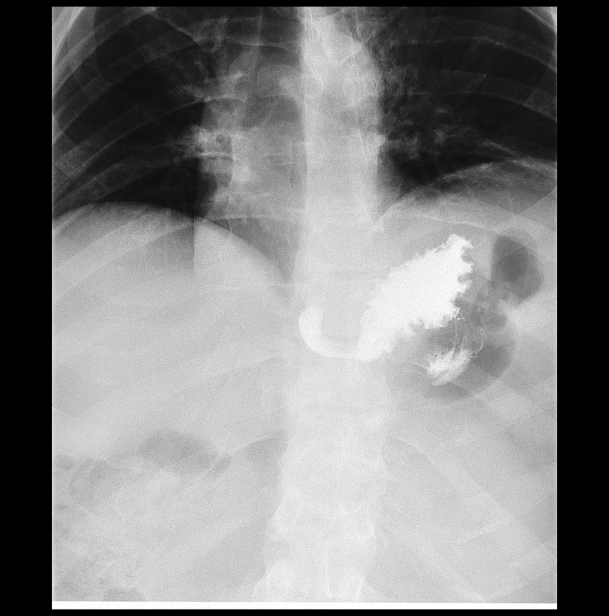

[Series 2: cp_standard · 0.27mm/px · 2 of 6 frames shown (1 of 3)]
[frame 4/6]
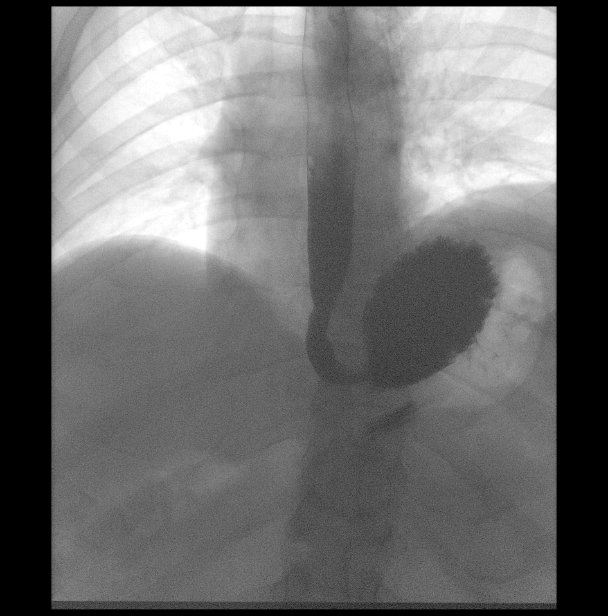
[frame 6/6]
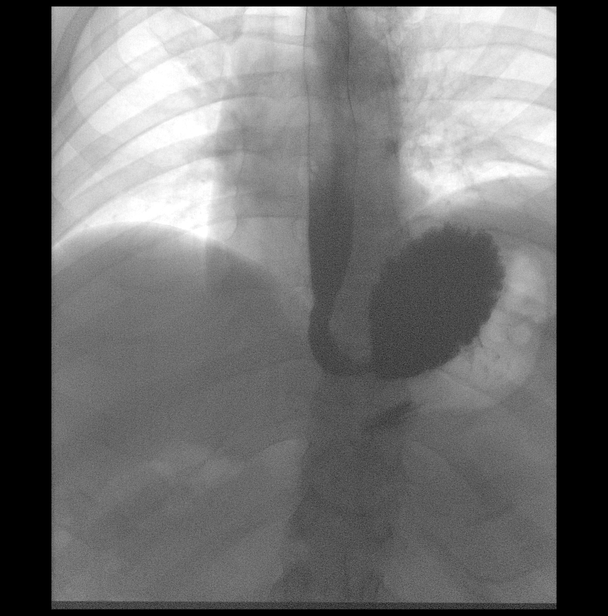

[Series 3: cp_standard · 0.27mm/px · 3 of 59 frames shown (2 of 3)]
[frame 30/59]
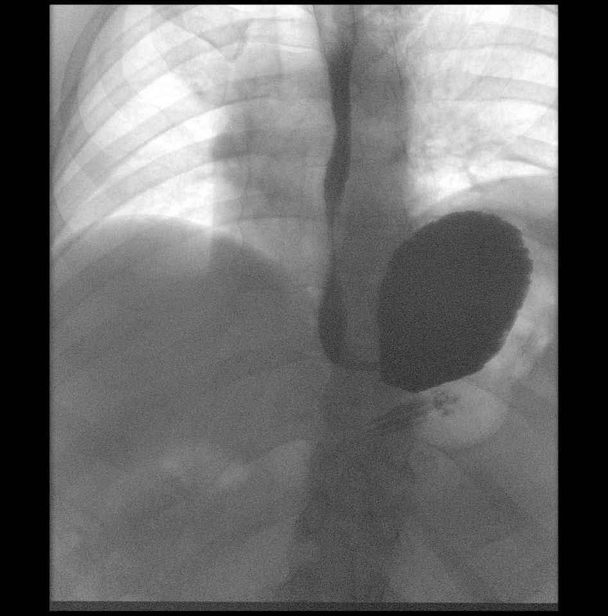
[frame 39/59]
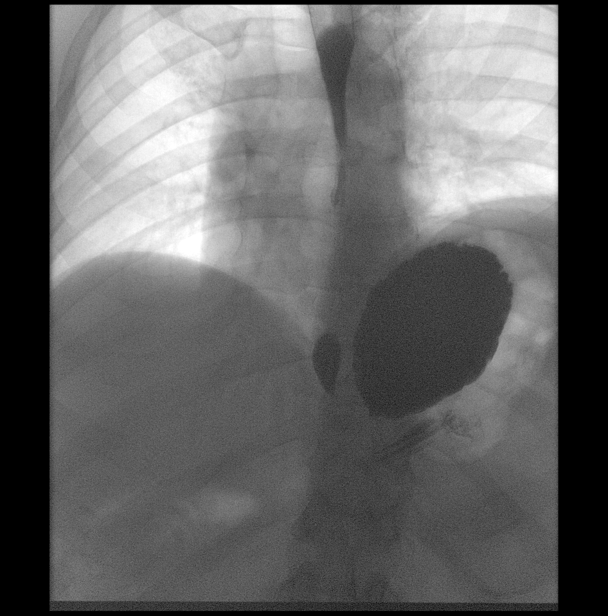
[frame 51/59]
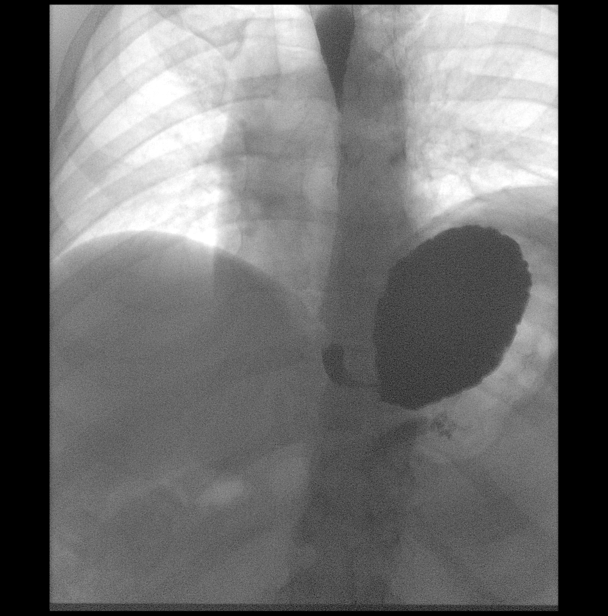

[Series 5: cp_standard · 0.27mm/px · 3 of 38 frames shown (3 of 3)]
[frame 1/38]
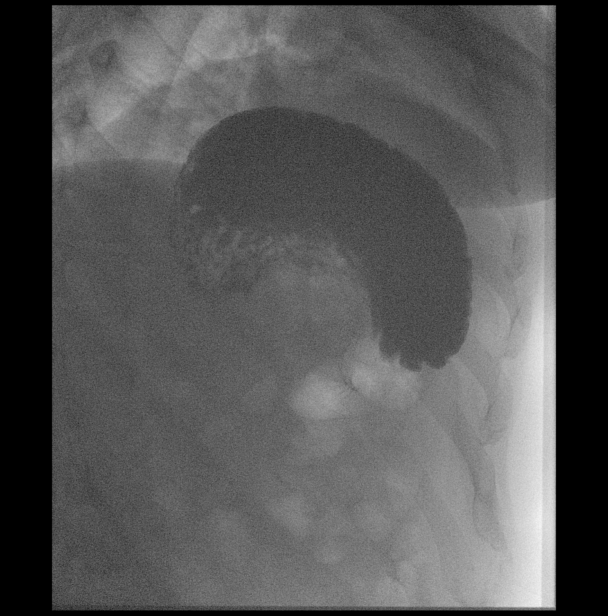
[frame 6/38]
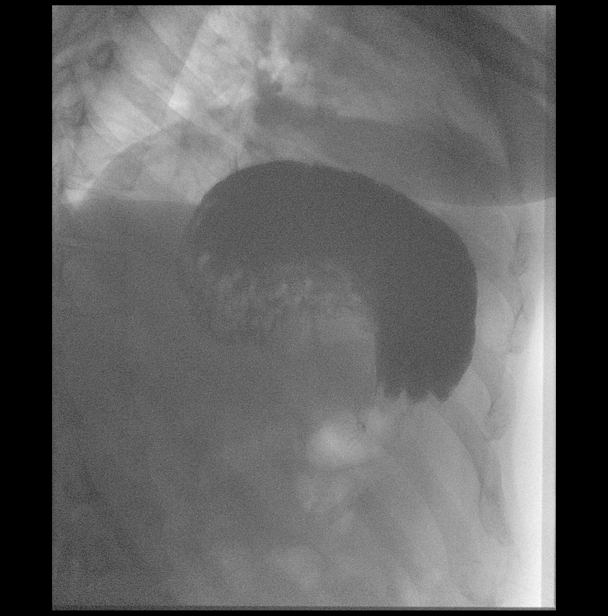
[frame 33/38]
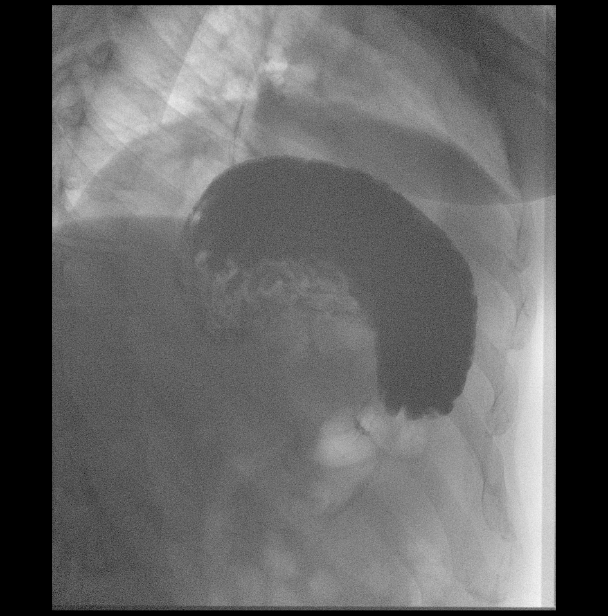

[Series 6: fluoro_barium 2fps_bw · 0.19mm/px · 1 of 1 slices shown (2 of 4)]
[im 1/1]
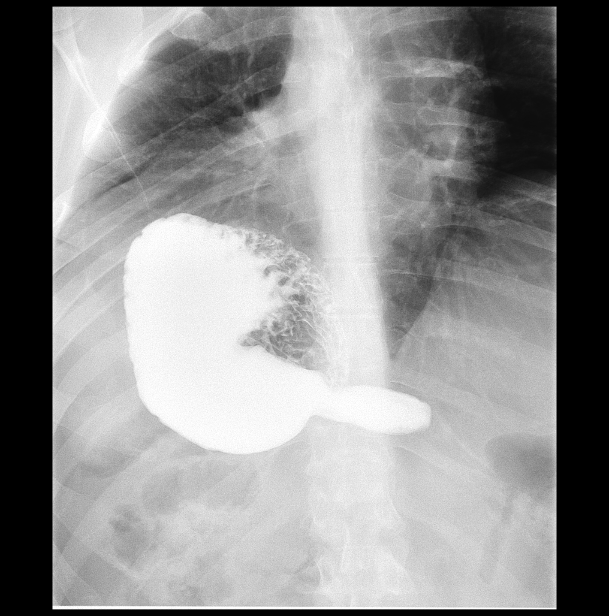

[Series 7: fluoro_barium 2fps_bw · 0.19mm/px · 3 of 11 frames shown (3 of 4)]
[frame 6/11]
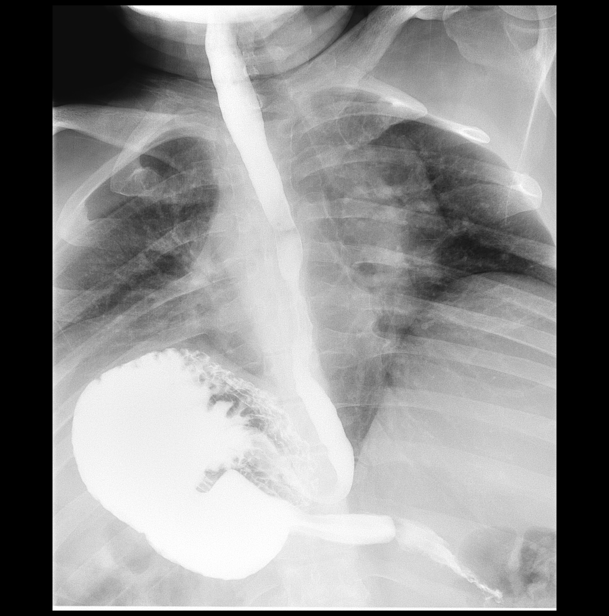
[frame 10/11]
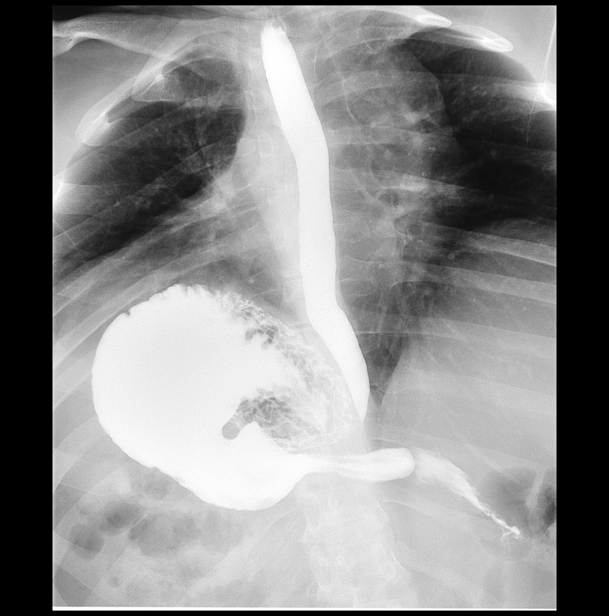
[frame 11/11]
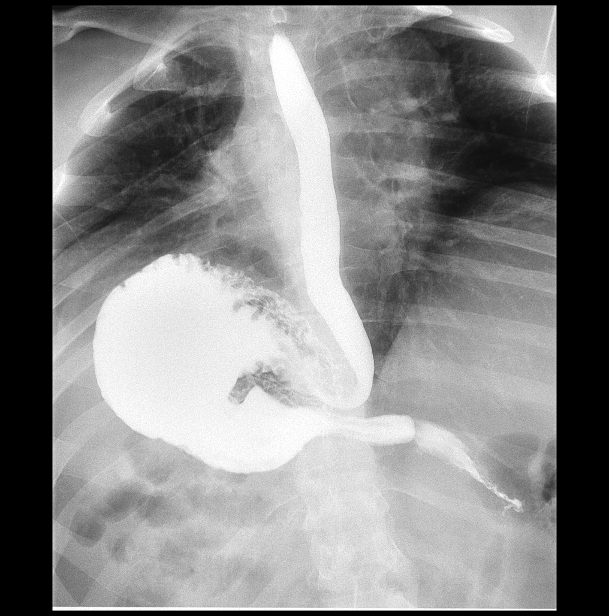

[Series 9: fluoro_barium 2fps_bw · 0.19mm/px · 1 of 1 slices shown (4 of 4)]
[im 1/1]
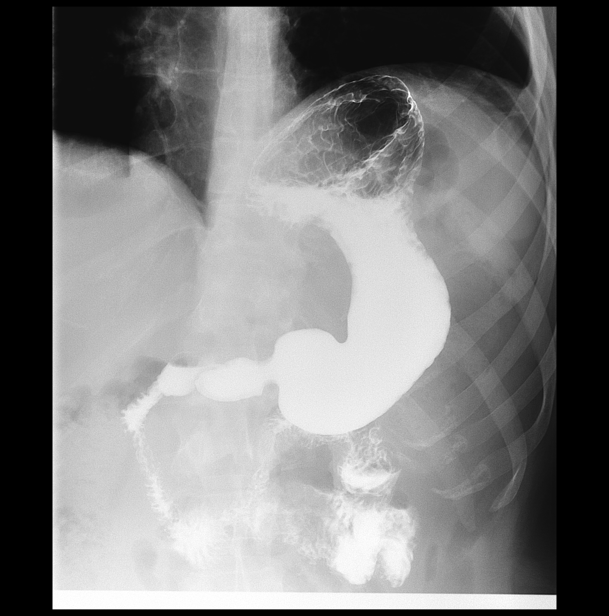

[14 of 20 positions shown; findings below may reference images not displayed]

FINDINGS: The patient had difficulty with standing upright on the fluoro table
due to vertigo issues and fear of falling. The study was converted
to a single contrast exam. The patient was able to climb onto the
fluoroscopy table and placed in hard on a position and very low
close to the floor. Images were obtained in the semi recumbent
position. The patient ingested barium without difficulty. A small
amount of gastroesophageal reflux was heard was seen. A tiny
reducible hiatal hernia was observed. The stomach is rotated in
appearance with the greater curvature positions superiorly and the
cardia somewhat inferiorly and medially. The mucosal pattern was
grossly normal where visualized. Gastric emptying was prompt. The
duodenal bulb and C sweep were normal in appearance. The patient was
then asked to stand in front of the upright fluoroscopy table with
the platform removed which he was able to do. He then ingested the
barium tablet. It passed promptly from the mouth to the distal
esophagus, hung transiently, and passed into the stomach with
additional sips of barium.
IMPRESSION: Normal esophageal motility in survey fashion. Tiny reducible hiatal
hernia. Moderate amount of spontaneous gastroesophageal reflux. No
evidence of stricture nor esophagitis.

Somewhat mobile appearing stomach with the greater curvature
appearing to lie superiorly when the patient is supine but assuming
in more anatomic position when the patient is standing upright. No
gross evidence of gastritis or ulceration. No obstructive to gastric
emptying. The duodenum and ligament of Treitz appear appropriately
positioned.

## 2019-11-17 ENCOUNTER — Other Ambulatory Visit: Payer: Self-pay

## 2019-11-17 ENCOUNTER — Ambulatory Visit: Payer: Medicare Other

## 2019-11-17 ENCOUNTER — Ambulatory Visit: Payer: Medicare Other | Admitting: Physical Therapy

## 2019-11-17 ENCOUNTER — Encounter: Payer: Self-pay | Admitting: Physical Therapy

## 2019-11-17 DIAGNOSIS — R2681 Unsteadiness on feet: Secondary | ICD-10-CM

## 2019-11-17 NOTE — Therapy (Signed)
[place] MAIN [facility] SERVICES [address] [place], [place], [address] Phone: [phone]   Fax:  [phone]  Physical Therapy Treatment  Patient Details  Name: Timothy Farley MRN: [id number] Date of Birth: 1976-03-05 Referring Provider (PT): Dr. Bethann Punches   Encounter Date: 11/17/2019   PT End of Session - 11/17/19 1453    Visit Number 6    Number of Visits 17    Date for PT Re-Evaluation 12/22/19    Authorization Type Eval: 10/27/19    PT Start Time 1446    PT Stop Time 1530    PT Time Calculation (min) 44 min    Equipment Utilized During Treatment Gait belt    Activity Tolerance Patient tolerated treatment well    Behavior During Therapy Impulsive           Past Medical History:  Diagnosis Date  . Arthritis   . GERD (gastroesophageal reflux disease)   . Left foot drop   . Pleurisy   . Short-term memory loss   . Subdural hematoma ([place])   . TBI (traumatic brain injury) ([place]) 1994    Past Surgical History:  Procedure Laterality Date  . BRAIN SURGERY    . brain surgery x3    . ESOPHAGOGASTRODUODENOSCOPY (EGD) WITH PROPOFOL N/A 02/19/2018   Procedure: ESOPHAGOGASTRODUODENOSCOPY (EGD) WITH PROPOFOL;  Surgeon: Toledo, Benay Pike, MD;  Location: [facility] ENDOSCOPY;  Service: Gastroenterology;  Laterality: N/A;  . GASTROSTOMY W/ FEEDING TUBE    . trachostemy      There were no vitals filed for this visit.   Subjective Assessment - 11/17/19 1451    Subjective Patient reports doing well; no new falls but does report near misses; He reports continued back pain;    Pertinent History Patient is a 44 year old male with history of falling/unbalance. He notes that the room is always spinning and he has to hold onto his mom's shirt/arm when walking. He has had 3 falls in the last few months. One of which was taking a step backwards, another forward, and the third his L foot got stuck and he fell forward. He notes that his L knee buckles. He had  a TBI when he was younger, and is L side was affected most. He has back pain that goes down his L leg. He used to get Cortizone shots before his insurance stopped paying for them. His goal is to walk down the street confidently without holding on to anything. He notes that he does not want to walk with a cane or walker.    Limitations House hold activities;Walking    Patient Stated Goals Walk down the street confidently without holding on to anything    Currently in Pain? Yes    Pain Score 6     Pain Location Back    Pain Orientation Lower    Pain Descriptors / Indicators Aching;Sore    Pain Type Chronic pain    Pain Onset More than a month ago    Pain Frequency Intermittent    Aggravating Factors  unsure    Pain Relieving Factors less when sitting    Effect of Pain on Daily Activities decreased activity tolerance;    Multiple Pain Sites No                  TREATMENT   Therapeutic Exercises NuStep L2-4 (seat 13, arms 13),x 4 mins and for cardiovascular endurance (unbilled);   3lb ankle weight: -lateral stepping 6x length of //  bars, cues for lifting LE's -hip extension 15x each LE ; R knee hyperextension due to fatigue. -BLE heel raises x15 reps with B rail assist;  Patient required min-moderate verbal/tactile cues for correct exercise technique including cues to reduce rail assist as able to increase LE weight bearing for strengthening;   Neuromuscular Re-education   Alternating 6" step taps with faded UE support 2-1, practiced multiple times without UE support, pt has significant anxiety regarding falling but is eventually able to perform multiple times on each leg x 10 BLE;  Airex WBOS eyes open static balance x 30 sec hold x2 reps Progress to eyes closed 15 sec hold x2 reps;  Standing on airex pad: -alternate toe taps to 6 inch step with 1 rail assist x15 reps each; -one foot on airex, one foot on 6 inch step unsupported standing 15 sec hold x2 reps each  foot on step -modified tandem stance:   Unsupported standing 10 sec hold  Head turns side/side x5 reps each direction with cues for gaze stabilization, does report mild dizziness;   Head turns up/down x5 reps;  Required min A for most balance exercise especially with reduced rail assist; He also required cues for increased weight shift and erect posture for better stance control;   He tolerated session well denying any increase in pain at end of session. He does report mild fatigue;                       PT Education - 11/17/19 1452    Education Details exercise technique/body mechanics;    Person(s) Educated Patient    Methods Explanation;Verbal cues    Comprehension Verbalized understanding;Returned demonstration;Verbal cues required;Need further instruction            PT Short Term Goals - 10/27/19 1900      PT SHORT TERM GOAL #1   Title Pt will be independent with HEP in order to improve strength and balance in order to decrease fall risk and improve function at home and work.    Baseline 10/27/19: HEP given    Time 4    Period Weeks    Status New    Target Date 11/24/19      PT SHORT TERM GOAL #2   Title Patient will be able to perform sit to stands without UE support in order to demonstrate increased functional strength of LEs.    Baseline 10/27/19: Patient unable to perform sit to stands without UE support or higher chair    Time 4    Period Weeks    Status New    Target Date 11/24/19             PT Long Term Goals - 10/27/19 1902      PT LONG TERM GOAL #1   Title Pt will improve BERG by at least 3 points in order to demonstrate clinically significant improvement in balance.    Baseline 10/27/19: 41/56    Time 8    Period Weeks    Status New    Target Date 12/22/19      PT LONG TERM GOAL #2   Title Pt will perform 5TSTS in less than 12 seconds in order to decrease risk for falls.    Baseline 10/27/19: Unable to perform due to UE support     Time 8    Period Weeks    Status New    Target Date 12/22/19      PT LONG  TERM GOAL #3   Title Pt will improve ABC by at least 13% in order to demonstrate clinically significant improvement in balance confidence.    Baseline 10/27/19: 51.25%    Time 8    Period Weeks    Status New    Target Date 12/22/19      PT LONG TERM GOAL #4   Title Patient will increase FOTO score to equal to or greater than 55 to demonstrate statistically significant improvement in mobility and quality of life    Baseline 10/27/19: 50    Time 8    Period Weeks    Status New    Target Date 12/22/19      PT LONG TERM GOAL #5   Title Patient will improve 10MWT self-selected pace to 1.34m/s in order to be considered a full community ambulator.    Baseline 10/27/19: self-selected: 0.85 m/s, fastest: 1.53m/s    Time 8    Period Weeks    Status New    Target Date 12/22/19                 Plan - 11/18/19 0813    Clinical Impression Statement Patient motivated and participated well within session. He continues to be hesitant/fearful of mobility with less UE assist. Patient does require min A for most balance tasks especially while on compliant surfaces. He exhibits decreased ankle strategies often reaching out for bars for balance recovery. He also complained of increased dizziness with head turns when standing on foam surfaces.  Patient would benefit from additional skilled PT intervention to improve strength, balance and mobility;    Personal Factors and Comorbidities Comorbidity 3+;Time since onset of injury/illness/exacerbation;Past/Current Experience    Comorbidities TBI, GERD, cognitive deficits, back pain    Examination-Activity Limitations Locomotion Level;Reach Overhead;Bathing;Stairs;Caring for Others    Examination-Participation Restrictions Driving;Community Activity;Meal Prep;Occupation;Volunteer;Yard Work    [employer] officer Evolving/Moderate complexity    Rehab Potential Fair     PT Frequency 2x / week    PT Duration 8 weeks    PT Treatment/Interventions ADLs/Self Care Home Management;Aquatic Therapy;Canalith Repostioning;Gait training;Stair training;Functional mobility training;Therapeutic activities;Balance training;Neuromuscular re-education;Therapeutic exercise;Patient/family education;Dry needling;Vestibular;Spinal Manipulations;Joint Manipulations;Electrical Stimulation;Cryotherapy;Passive range of motion;Traction;Moist Heat;Ultrasound;Cognitive remediation;Manual techniques    PT Next Visit Plan Look into ER of L LE; ontinue with strengthening and balance exercises, progress HEP    PT Home Exercise Plan Medbridge Code: [email or web address]    Consulted and Agree with Plan of Care Patient           Patient will benefit from skilled therapeutic intervention in order to improve the following deficits and impairments:  Abnormal gait, Decreased strength, Dizziness, Difficulty walking, Decreased balance, Decreased coordination, Pain  Visit Diagnosis: Unsteadiness on feet     Problem List Patient Active Problem List   Diagnosis Date Noted  . Lumbar facet arthropathy 02/24/2018  . Chronic pain syndrome 02/24/2018  . 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 History of traumatic brain injury 06/10/2014  . Osteoarthritis of right knee 06/10/2014    Yeshaya Vath PT, DPT 11/18/2019, 8:16 AM  [place] MAIN [facility] SERVICES [address] [place], [place], [address] Phone: [phone]   Fax:  [phone]  Name: Timothy Farley MRN: [id number] Date of Birth: May 15, 1975

## 2019-11-19 ENCOUNTER — Ambulatory Visit: Payer: Medicare Other

## 2019-11-19 ENCOUNTER — Other Ambulatory Visit: Payer: Self-pay

## 2019-11-19 DIAGNOSIS — R2681 Unsteadiness on feet: Secondary | ICD-10-CM | POA: Diagnosis not present

## 2019-11-19 NOTE — Therapy (Signed)
[place] MAIN [facility] SERVICES [address] [place], [place], [address] Phone: [phone]   Fax:  [phone]  Physical Therapy Treatment  Patient Details  Name: Timothy Farley MRN: [id number] Date of Birth: [birth year] Referring Provider (PT): Dr. Bethann Punches   Encounter Date: 11/19/2019   PT End of Session - 11/19/19 1518    Visit Number 7    Number of Visits 17    Date for PT Re-Evaluation 12/22/19    Authorization Type Eval: 10/27/19    PT Start Time 1520    PT Stop Time 1605    PT Time Calculation (min) 45 min    Equipment Utilized During Treatment Gait belt    Activity Tolerance Patient tolerated treatment well    Behavior During Therapy Impulsive           Past Medical History:  Diagnosis Date  . Arthritis   . GERD (gastroesophageal reflux disease)   . Left foot drop   . Pleurisy   . Short-term memory loss   . Subdural hematoma ([place])   . TBI (traumatic brain injury) ([place]) 1994    Past Surgical History:  Procedure Laterality Date  . BRAIN SURGERY    . brain surgery x3    . ESOPHAGOGASTRODUODENOSCOPY (EGD) WITH PROPOFOL N/A 02/19/2018   Procedure: ESOPHAGOGASTRODUODENOSCOPY (EGD) WITH PROPOFOL;  Surgeon: Toledo, Benay Pike, MD;  Location: [facility] ENDOSCOPY;  Service: Gastroenterology;  Laterality: N/A;  . GASTROSTOMY W/ FEEDING TUBE    . trachostemy      There were no vitals filed for this visit.   Subjective Assessment - 11/19/19 1517    Subjective Patient reports his back is really bothering him today. He rates his pain as a 7/10. No new falls since last therapy session.  No specific questions or concerns at this time    Pertinent History Patient is a 44 year old male with history of falling/unbalance. He notes that the room is always spinning and he has to hold onto his mom's shirt/arm when walking. He has had 3 falls in the last few months. One of which was taking a step backwards, another forward, and the third his L foot  got stuck and he fell forward. He notes that his L knee buckles. He had a TBI when he was younger, and is L side was affected most. He has back pain that goes down his L leg. He used to get Cortizone shots before his insurance stopped paying for them. His goal is to walk down the street confidently without holding on to anything. He notes that he does not want to walk with a cane or walker.    Limitations House hold activities;Walking    Patient Stated Goals Walk down the street confidently without holding on to anything    Currently in Pain? Yes    Pain Score 7     Pain Location Back    Pain Orientation Lower    Pain Descriptors / Indicators Aching;Sore    Pain Type Chronic pain    Pain Onset More than a month ago    Pain Frequency Intermittent              TREATMENT   Therapeutic Exercises NuStep L2-3 (seat 14, arms 13) LE only x93mins and for cardiovascular endurance during history (3 minutes unbilled);   Sit to stand without UE support from elevated mat table 2 x 10, table lowered for second set, pt requires cues for anterior weight shifting and not  supporting himself with the back of his legs on the table;  3lb ankle weight with BUE support: Hip abduction x 20 BLE; Hip extension x 20 BLE; HS curls x 20 BLE;  Patient required min-moderate verbal/tactile cues for correct exercise technique;   Neuromuscular Re-education Alternating 5" step taps with RUE support, pt has significant anxiety regarding falling but is eventually able to perform multiple times on each leg x 10 BLE; Airex WBOS eyes open/closed static balance x 30 sec each; Airex WBOS eyes open horizontal and vertical head turns x 30s each;   Pt educated throughout session about proper posture and technique with exercises. Improved exercise technique, movement at target joints, use of target muscles after min to mod verbal, visual, tactile cues.    Patient motivated and participated well within  session. He continues to be hesitant/fearful of balance exercises and more so today because they were performed using ballet bar instead of inside the parallel bars. Patient does require min A for most balance tasks especially while on compliant surfaces. He struggles with balancing when closing his eyes. Pt fatigues relatively quickly and requires multiple seated rest breaks throughout session. Patient would benefit from additional skilled PT intervention to improve strength, balance and mobility.                    PT Short Term Goals - 10/27/19 1900      PT SHORT TERM GOAL #1   Title Pt will be independent with HEP in order to improve strength and balance in order to decrease fall risk and improve function at home and work.    Baseline 10/27/19: HEP given    Time 4    Period Weeks    Status New    Target Date 11/24/19      PT SHORT TERM GOAL #2   Title Patient will be able to perform sit to stands without UE support in order to demonstrate increased functional strength of LEs.    Baseline 10/27/19: Patient unable to perform sit to stands without UE support or higher chair    Time 4    Period Weeks    Status New    Target Date 11/24/19             PT Long Term Goals - 10/27/19 1902      PT LONG TERM GOAL #1   Title Pt will improve BERG by at least 3 points in order to demonstrate clinically significant improvement in balance.    Baseline 10/27/19: 41/56    Time 8    Period Weeks    Status New    Target Date 12/22/19      PT LONG TERM GOAL #2   Title Pt will perform 5TSTS in less than 12 seconds in order to decrease risk for falls.    Baseline 10/27/19: Unable to perform due to UE support    Time 8    Period Weeks    Status New    Target Date 12/22/19      PT LONG TERM GOAL #3   Title Pt will improve ABC by at least 13% in order to demonstrate clinically significant improvement in balance confidence.    Baseline 10/27/19: 51.25%    Time 8    Period Weeks     Status New    Target Date 12/22/19      PT LONG TERM GOAL #4   Title Patient will increase FOTO score to equal to or greater than  55 to demonstrate statistically significant improvement in mobility and quality of life    Baseline 10/27/19: 50    Time 8    Period Weeks    Status New    Target Date 12/22/19      PT LONG TERM GOAL #5   Title Patient will improve 10MWT self-selected pace to 1.58m/s in order to be considered a full community ambulator.    Baseline 10/27/19: self-selected: 0.85 m/s, fastest: 1.67m/s    Time 8    Period Weeks    Status New    Target Date 12/22/19                 Plan - 11/19/19 1518    Clinical Impression Statement Patient motivated and participated well within session. He continues to be hesitant/fearful of balance exercises and more so today because they were performed using ballet bar instead of inside the parallel bars. Patient does require min A for most balance tasks especially while on compliant surfaces. He struggles with balancing when closing his eyes. Pt fatigues relatively quickly and requires multiple seated rest breaks throughout session. Patient would benefit from additional skilled PT intervention to improve strength, balance and mobility.    Personal Factors and Comorbidities Comorbidity 3+;Time since onset of injury/illness/exacerbation;Past/Current Experience    Comorbidities TBI, GERD, cognitive deficits, back pain    Examination-Activity Limitations Locomotion Level;Reach Overhead;Bathing;Stairs;Caring for Others    Examination-Participation Restrictions Driving;Community Activity;Meal Prep;Occupation;Volunteer;Yard Work    [employer] officer Evolving/Moderate complexity    Rehab Potential Fair    PT Frequency 2x / week    PT Duration 8 weeks    PT Treatment/Interventions ADLs/Self Care Home Management;Aquatic Therapy;Canalith Repostioning;Gait training;Stair training;Functional mobility training;Therapeutic  activities;Balance training;Neuromuscular re-education;Therapeutic exercise;Patient/family education;Dry needling;Vestibular;Spinal Manipulations;Joint Manipulations;Electrical Stimulation;Cryotherapy;Passive range of motion;Traction;Moist Heat;Ultrasound;Cognitive remediation;Manual techniques    PT Next Visit Plan Continue with strengthening and balance exercises, progress HEP    PT Home Exercise Plan Medbridge Code: [email or web address]    Consulted and Agree with Plan of Care Patient           Patient will benefit from skilled therapeutic intervention in order to improve the following deficits and impairments:  Abnormal gait, Decreased strength, Dizziness, Difficulty walking, Decreased balance, Decreased coordination, Pain  Visit Diagnosis: Unsteadiness on feet     Problem List Patient Active Problem List   Diagnosis Date Noted  . Lumbar facet arthropathy 02/24/2018  . Chronic pain syndrome 02/24/2018  . 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 History of traumatic brain injury 06/10/2014  . Osteoarthritis of right knee 06/10/2014   Phillips Grout PT, DPT, GCS  Daud Cayer 11/19/2019, 4:14 PM  [place] MAIN [facility] SERVICES [address] [place], [place], [address] Phone: [phone]   Fax:  [phone]  Name: Timothy Farley MRN: [id number] Date of Birth: [birth year]

## 2019-11-24 ENCOUNTER — Other Ambulatory Visit: Payer: Self-pay

## 2019-11-24 ENCOUNTER — Ambulatory Visit: Payer: Medicare Other

## 2019-11-24 DIAGNOSIS — R2681 Unsteadiness on feet: Secondary | ICD-10-CM | POA: Diagnosis not present

## 2019-11-24 NOTE — Therapy (Signed)
[place] MAIN [facility] SERVICES [address] [place], [place], [address] Phone: [phone]   Fax:  [phone]  Physical Therapy Treatment  Patient Details  Name: Timothy Farley MRN: [id number] Date of Birth: Mar 14, 1975 Referring Provider (PT): Dr. Bethann Punches   Encounter Date: 11/24/2019   PT End of Session - 11/24/19 1344    Visit Number 8    Number of Visits 17    Date for PT Re-Evaluation 12/22/19    Authorization Type Eval: 10/27/19    PT Start Time 7425    PT Stop Time 1430    PT Time Calculation (min) 45 min    Equipment Utilized During Treatment Gait belt    Activity Tolerance Patient tolerated treatment well    Behavior During Therapy Impulsive           Past Medical History:  Diagnosis Date  . Arthritis   . GERD (gastroesophageal reflux disease)   . Left foot drop   . Pleurisy   . Short-term memory loss   . Subdural hematoma ([place])   . TBI (traumatic brain injury) ([place]) 1994    Past Surgical History:  Procedure Laterality Date  . BRAIN SURGERY    . brain surgery x3    . ESOPHAGOGASTRODUODENOSCOPY (EGD) WITH PROPOFOL N/A 02/19/2018   Procedure: ESOPHAGOGASTRODUODENOSCOPY (EGD) WITH PROPOFOL;  Surgeon: Toledo, Benay Pike, MD;  Location: [facility] ENDOSCOPY;  Service: Gastroenterology;  Laterality: N/A;  . GASTROSTOMY W/ FEEDING TUBE    . trachostemy      There were no vitals filed for this visit.   Subjective Assessment - 11/24/19 1343    Subjective Patient reports his back continues to bother him today but no more than normal. He rates his pain as a 7/10. No new falls since last therapy session.  No specific questions or concerns at this time    Pertinent History Patient is a 44 year old male with history of falling/unbalance. He notes that the room is always spinning and he has to hold onto his mom's shirt/arm when walking. He has had 3 falls in the last few months. One of which was taking a step backwards, another forward,  and the third his L foot got stuck and he fell forward. He notes that his L knee buckles. He had a TBI when he was younger, and is L side was affected most. He has back pain that goes down his L leg. He used to get Cortizone shots before his insurance stopped paying for them. His goal is to walk down the street confidently without holding on to anything. He notes that he does not want to walk with a cane or walker.    Limitations House hold activities;Walking    Patient Stated Goals Walk down the street confidently without holding on to anything    Currently in Pain? Yes    Pain Score 7     Pain Location Back    Pain Orientation Lower    Pain Descriptors / Indicators Aching    Pain Type Chronic pain    Pain Onset More than a month ago    Pain Frequency Intermittent              TREATMENT   Therapeutic Exercises NuStep L2 (seat 15, arms 12) x7mins and for cardiovascular endurance and warm-up during history(2 minutes unbilled);  Sit to stand without UE support from regular height chair with Airex pad on seat, pt requires cues for anterior weight shifting and not  supporting himself with the back of his legs on the table, while resting between sets heat pack applied to low back to help with low back discomfort;  4lb ankle weight with BUE support: Hip abduction x 20 BLE; Hip extension x 20 BLE; HS curls x 20 BLE; Hip flexion marches x 20 BLE  Patient required min-moderate verbal/tactile cues for correct exercise technique;   Neuromuscular Re-education Walking with ball passes to the left and right, pt becomes extremely anxious and fearful of falling during ambulation, requires additional time and repeated encouragement to perform;  Alternating 5" step taps with faded UE support, pt has significant anxiety regarding falling but is eventually able to perform multiple times on each leg with single UE support and one rep with no UE support;   Pt educated throughout  session about proper posture and technique with exercises. Improved exercise technique, movement at target joints, use of target muscles after min to mod verbal, visual, tactile cues.    Patient motivated and participated well within session. He continues to be hesitant/fearful of balance exercises and especially when attempting dual tasking/dynamic balance activities today. Patient does require min A for most balance tasks. He does improve in his ability to perform toe taps today with faded UE support.  Utilize moist heat on low back to minimize pain during exercise today when patient is resting between sets.  Per patient request order sent by front office to Dr. Ouida Sills for signature to include low back pain in his PT referral. Patient would benefit from additional skilled PT intervention to improve strength, balance and mobility.                          PT Short Term Goals - 10/27/19 1900      PT SHORT TERM GOAL #1   Title Pt will be independent with HEP in order to improve strength and balance in order to decrease fall risk and improve function at home and work.    Baseline 10/27/19: HEP given    Time 4    Period Weeks    Status New    Target Date 11/24/19      PT SHORT TERM GOAL #2   Title Patient will be able to perform sit to stands without UE support in order to demonstrate increased functional strength of LEs.    Baseline 10/27/19: Patient unable to perform sit to stands without UE support or higher chair    Time 4    Period Weeks    Status New    Target Date 11/24/19             PT Long Term Goals - 10/27/19 1902      PT LONG TERM GOAL #1   Title Pt will improve BERG by at least 3 points in order to demonstrate clinically significant improvement in balance.    Baseline 10/27/19: 41/56    Time 8    Period Weeks    Status New    Target Date 12/22/19      PT LONG TERM GOAL #2   Title Pt will perform 5TSTS in less than 12 seconds in order to decrease  risk for falls.    Baseline 10/27/19: Unable to perform due to UE support    Time 8    Period Weeks    Status New    Target Date 12/22/19      PT LONG TERM GOAL #3   Title Pt will  improve ABC by at least 13% in order to demonstrate clinically significant improvement in balance confidence.    Baseline 10/27/19: 51.25%    Time 8    Period Weeks    Status New    Target Date 12/22/19      PT LONG TERM GOAL #4   Title Patient will increase FOTO score to equal to or greater than 55 to demonstrate statistically significant improvement in mobility and quality of life    Baseline 10/27/19: 50    Time 8    Period Weeks    Status New    Target Date 12/22/19      PT LONG TERM GOAL #5   Title Patient will improve 10MWT self-selected pace to 1.4m/s in order to be considered a full community ambulator.    Baseline 10/27/19: self-selected: 0.85 m/s, fastest: 1.33m/s    Time 8    Period Weeks    Status New    Target Date 12/22/19                 Plan - 11/24/19 1345    Clinical Impression Statement Patient motivated and participated well within session. He continues to be hesitant/fearful of balance exercises and especially when attempting dual tasking/dynamic balance activities today. Patient does require min A for most balance tasks. He does improve in his ability to perform toe taps today with faded UE support.  Utilize moist heat on low back to minimize pain during exercise today when patient is resting between sets.  Per patient request order sent by front office to Dr. Ouida Sills for signature to include low back pain in his PT referral. Patient would benefit from additional skilled PT intervention to improve strength, balance and mobility.    Personal Factors and Comorbidities Comorbidity 3+;Time since onset of injury/illness/exacerbation;Past/Current Experience    Comorbidities TBI, GERD, cognitive deficits, back pain    Examination-Activity Limitations Locomotion Level;Reach  Overhead;Bathing;Stairs;Caring for Others    Examination-Participation Restrictions Driving;Community Activity;Meal Prep;Occupation;Volunteer;Yard Work    [employer] officer Evolving/Moderate complexity    Rehab Potential Fair    PT Frequency 2x / week    PT Duration 8 weeks    PT Treatment/Interventions ADLs/Self Care Home Management;Aquatic Therapy;Canalith Repostioning;Gait training;Stair training;Functional mobility training;Therapeutic activities;Balance training;Neuromuscular re-education;Therapeutic exercise;Patient/family education;Dry needling;Vestibular;Spinal Manipulations;Joint Manipulations;Electrical Stimulation;Cryotherapy;Passive range of motion;Traction;Moist Heat;Ultrasound;Cognitive remediation;Manual techniques    PT Next Visit Plan Continue with strengthening and balance exercises, progress HEP    PT Home Exercise Plan Medbridge Code: [email or web address]    Consulted and Agree with Plan of Care Patient           Patient will benefit from skilled therapeutic intervention in order to improve the following deficits and impairments:  Abnormal gait, Decreased strength, Dizziness, Difficulty walking, Decreased balance, Decreased coordination, Pain  Visit Diagnosis: Unsteadiness on feet     Problem List Patient Active Problem List   Diagnosis Date Noted  . Lumbar facet arthropathy 02/24/2018  . Chronic pain syndrome 02/24/2018  . 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 History of traumatic brain injury 06/10/2014  . Osteoarthritis of right knee 06/10/2014    Phillips Grout PT, DPT, GCS  Timothy Farley 11/24/2019, 2:45 PM  [place] MAIN [facility] SERVICES [address] [place], [place], [address] Phone: [phone]   Fax:  [phone]  Name: Timothy Farley MRN: [id number] Date of Birth: 16-Aug-1975

## 2019-11-26 ENCOUNTER — Ambulatory Visit: Payer: Medicare Other

## 2019-11-26 ENCOUNTER — Other Ambulatory Visit: Payer: Self-pay

## 2019-11-26 DIAGNOSIS — R2681 Unsteadiness on feet: Secondary | ICD-10-CM

## 2019-11-26 DIAGNOSIS — G8929 Other chronic pain: Secondary | ICD-10-CM

## 2019-11-26 NOTE — Therapy (Signed)
[place] MAIN [facility] SERVICES [address] [place], [place], [address] Phone: [phone]   Fax:  [phone]  Physical Therapy Treatment  Patient Details  Name: Timothy Farley MRN: [id number] Date of Birth: 1975-08-31 Referring Provider (PT): Dr. Bethann Punches   Encounter Date: 11/26/2019   PT End of Session - 11/26/19 1542    Visit Number 9    Number of Visits 17    Date for PT Re-Evaluation 12/22/19    Authorization Type Eval: 10/27/19    PT Start Time 1515    PT Stop Time 1600    PT Time Calculation (min) 45 min    Equipment Utilized During Treatment Gait belt    Activity Tolerance Patient tolerated treatment well    Behavior During Therapy Impulsive           Past Medical History:  Diagnosis Date  . Arthritis   . GERD (gastroesophageal reflux disease)   . Left foot drop   . Pleurisy   . Short-term memory loss   . Subdural hematoma ([place])   . TBI (traumatic brain injury) ([place]) 1994    Past Surgical History:  Procedure Laterality Date  . BRAIN SURGERY    . brain surgery x3    . ESOPHAGOGASTRODUODENOSCOPY (EGD) WITH PROPOFOL N/A 02/19/2018   Procedure: ESOPHAGOGASTRODUODENOSCOPY (EGD) WITH PROPOFOL;  Surgeon: Toledo, Benay Pike, MD;  Location: [facility] ENDOSCOPY;  Service: Gastroenterology;  Laterality: N/A;  . GASTROSTOMY W/ FEEDING TUBE    . trachostemy      There were no vitals filed for this visit.   Subjective Assessment - 11/26/19 1519    Subjective Patient reports his back continues to cause him discomfort. He rates his pain as a 7/10. No new falls since last therapy session. He states that he is performing his HEP but not as consistently as he "should."  He has started to integrate some of his low back stretches. No specific questions or concerns at this time    Pertinent History Patient is a 44 year old male with history of falling/unbalance. He notes that the room is always spinning and he has to hold onto his mom's  shirt/arm when walking. He has had 3 falls in the last few months. One of which was taking a step backwards, another forward, and the third his L foot got stuck and he fell forward. He notes that his L knee buckles. He had a TBI when he was younger, and is L side was affected most. He has back pain that goes down his L leg. He used to get Cortizone shots before his insurance stopped paying for them. His goal is to walk down the street confidently without holding on to anything. He notes that he does not want to walk with a cane or walker. 11/26/19: Pt complains of low back pain that has been going on for multiple years, since his mid/late 58s. Initially pain progressively worsened but over the last couple years has stayed relatively consistent. No history of injury, trauma, or surgery. He was seen by Dr. Sharlet Salina with physiatry who was going to do spinal injections but due to patient's anxiety regarding being able to lay safely on his narrow exam table pt was instead referred him to pain management. He has had plain film radiographs of his lumbar spine from [facility] dated 12/24/2017 which showed mild degenerative findings from L3-S1 most significantly at L5-S1 and facet degenerative changes at L5-S1. No acute findings. Pt underwent 2 rounds of  spinal injections with pain management which were helpful for his pain but his insurance wouldn't authorize any additional injections. At the time pt was unwilling to try physical therapy for his back pain however he is interested in pursuing conservative therapy management at this time. He has a remote history of a traumatic brain injury which caused ataxia and weakness to the left side of his body including L foot drop. He is currently being treated at this clinic for his imbalance and would like to include treatment for his low back pain.    Limitations House hold activities;Walking    Patient Stated Goals Walk down the street confidently without holding on to  anything    Currently in Pain? Yes    Pain Score 7    Worst: 10/10, Best: 0/10   Pain Location Back    Pain Orientation Lower;Right;Left    Pain Descriptors / Indicators Aching;Throbbing    Pain Type Chronic pain    Pain Onset More than a month ago    Pain Frequency Intermittent    Aggravating Factors  rolling in bed, transfers after extended sitting, most aggravation from forward flexion (bending over)    Pain Relieving Factors laying down on back otherwise pt doesn't know any additional relieving factors    Multiple Pain Sites No            TREATMENT    SUBJECTIVE History: Pt complains of low back pain that has been going on for multiple years, since his mid/late 64s. Initially pain progressively worsened but over the last couple years has stayed relatively consistent. No history of injury, trauma, or surgery. He was seen by Dr. Sharlet Salina with physiatry who was going to do spinal injections but due to patient's anxiety regarding being able to lay safely on his narrow exam table pt was instead referred him to pain management. He has had plain film radiographs of his lumbar spine from [facility] dated 12/24/2017 which showed mild degenerative findings from L3-S1 most significantly at L5-S1 and facet degenerative changes at L5-S1. No acute findings. Pt underwent 2 rounds of spinal injections with pain management which were helpful for his pain but his insurance wouldn't authorize any additional injections. At the time pt was unwilling to try physical therapy for his back pain however he is interested in pursuing conservative therapy management at this time. He has a remote history of a traumatic brain injury which caused ataxia and weakness to the left side of his body including L foot drop. He is currently being treated at this clinic for his imbalance and would like to include treatment for his low back pain.  Pain location: Midline low back extending into L hip, never extends past upper  thigh; Radiating pain: Yes  Numbness/Tingling: No 24 hour pain behavior: Unable to answer today upon questioning History of back injury, surgery, or therapy: Pt has had 2 rounds of spinal injections for pain Imaging: Yes, see history above Red flags (bowel/bladder changes, saddle paresthesia, personal history of cancer, chills/fever, night sweats, unrelenting pain, first onset of insidious LBP <20 y/o) Negative    OBJECTIVE   MUSCULOSKELETAL: Tremor: None Bulk: Normal Tone: LUE/LLE weakness secondary to remote TBI No visible step-off along spinal column  Posture Lumbar lordosis: WNL Iliac crest height: equal bilaterally Lumbar lateral shift: negative    Palpation Reproduction of patient's pain with palpation to bilateral lumbar paraspinals especially on the L side. Pt reports increase in his pain to 10/10 with palpation of lower L side  of back.      AROM (degrees) R/L (all movements include overpressure unless otherwise stated) Lumbar forward flexion (65): moderate limitation, significant pain Lumbar extension (30): Mild limitation, no pain Lumbar lateral flexion (25): Grossly WNL, no pain Thoracic and Lumbar rotation (30 degrees): Moderate limitation, no pain but pt reports stretch; Limited hip IR bilaterally, especially on the R side, mild increase in LBP with R hip PROM with overpressure.  Hip Flexion (0-125): WNL with low back stretch;   Repeated Movements No centralization or peripheralization of symptoms with repeated lumbar flexion but limited due to reported increase in low back pain with flexion;   Muscle Length Hamstrings: Significantly shortened bilateral hamstrings, approximately 60 degrees;   Passive Accessory Intervertebral Motion (PAIVM) Pt reports reproduction of low back pain with CPA L3-L5, worse lower. Upper lumbar segments are painless with normal mobility. UPA testing is limited due to pain with palpation.     SPECIAL TESTS Lumbar Radiculopathy and  Discogenic: Centralization and Peripheralization (SN 92, -LR 0.12): Negative Slump (SN 83, -LR 0.32): R: Negative L: Negative SLR (SN 92, -LR 0.29): R: Negative L:  Negative Crossed SLR (SP 90): R: Negative L: Negative  Facet Joint: Extension-Rotation (SN 100, -LR 0.0): R: Not done L: Not done  Lumbar Spinal Stenosis: Lumbar quadrant (SN 70): R: Not done L: Not done  Hip: FABER (SN 81): R: Negative L: Negative FADIR (SN 94): R: Negative L: Negative Hip scour (SN 50): R: Negative L: Negative  SIJ:  Thigh Thrust (SN 88, -LR 0.18) : R: Not done L: Not done  Piriformis Syndrome: FAIR Test (SN 88, SP 83): R: Negative L: Negative                      PT Education - 11/26/19 1547    Education Details Plan of care    Person(s) Educated Patient    Methods Explanation    Comprehension Verbalized understanding            PT Short Term Goals - 10/27/19 1900      PT SHORT TERM GOAL #1   Title Pt will be independent with HEP in order to improve strength and balance in order to decrease fall risk and improve function at home and work.    Baseline 10/27/19: HEP given    Time 4    Period Weeks    Status New    Target Date 11/24/19      PT SHORT TERM GOAL #2   Title Patient will be able to perform sit to stands without UE support in order to demonstrate increased functional strength of LEs.    Baseline 10/27/19: Patient unable to perform sit to stands without UE support or higher chair    Time 4    Period Weeks    Status New    Target Date 11/24/19             PT Long Term Goals - 10/27/19 1902      PT LONG TERM GOAL #1   Title Pt will improve BERG by at least 3 points in order to demonstrate clinically significant improvement in balance.    Baseline 10/27/19: 41/56    Time 8    Period Weeks    Status New    Target Date 12/22/19      PT LONG TERM GOAL #2   Title Pt will perform 5TSTS in less than 12 seconds in order to decrease risk for falls.  Baseline 10/27/19: Unable to perform due to UE support    Time 8    Period Weeks    Status New    Target Date 12/22/19      PT LONG TERM GOAL #3   Title Pt will improve ABC by at least 13% in order to demonstrate clinically significant improvement in balance confidence.    Baseline 10/27/19: 51.25%    Time 8    Period Weeks    Status New    Target Date 12/22/19      PT LONG TERM GOAL #4   Title Patient will increase FOTO score to equal to or greater than 55 to demonstrate statistically significant improvement in mobility and quality of life    Baseline 10/27/19: 50    Time 8    Period Weeks    Status New    Target Date 12/22/19      PT LONG TERM GOAL #5   Title Patient will improve 10MWT self-selected pace to 1.52m/s in order to be considered a full community ambulator.    Baseline 10/27/19: self-selected: 0.85 m/s, fastest: 1.74m/s    Time 8    Period Weeks    Status New    Target Date 12/22/19                 Plan - 11/26/19 1548    Clinical Impression Statement Session today focused on limited assessment of low back pain.  Patient would like to incorporate low back therapy into his additional balance training.  During assessment today patient demonstrates significant pain with forward lumbar flexion.  Patient is very tender to palpation along the bilateral lower lumbar paraspinals especially on the left side.  Patient is also painful with central passive accessory mobility testing of L3-L5.  No reproduction of pain radiating into left hip during session today.  Examination is slightly limited due to extended time required for patient to position prone on table.  Will begin incorporating some manual techniques and strengthening for low back while continuing training for leg strength and balance.  Pt presents with deficits in strength, mobility, range of motion, and pain. Pt will benefit from skilled PT services to address deficits and return to pain-free function at home and work.     Personal Factors and Comorbidities Comorbidity 3+;Time since onset of injury/illness/exacerbation;Past/Current Experience    Comorbidities TBI, GERD, cognitive deficits, back pain    Examination-Activity Limitations Locomotion Level;Reach Overhead;Bathing;Stairs;Caring for Others    Examination-Participation Restrictions Driving;Community Activity;Meal Prep;Occupation;Volunteer;Yard Work    [employer] Evolving/Moderate complexity    Rehab Potential Fair    PT Frequency 2x / week    PT Duration 8 weeks    PT Treatment/Interventions ADLs/Self Care Home Management;Aquatic Therapy;Canalith Repostioning;Gait training;Stair training;Functional mobility training;Therapeutic activities;Balance training;Neuromuscular re-education;Therapeutic exercise;Patient/family education;Dry needling;Vestibular;Spinal Manipulations;Joint Manipulations;Electrical Stimulation;Cryotherapy;Passive range of motion;Traction;Moist Heat;Ultrasound;Cognitive remediation;Manual techniques    PT Next Visit Plan Progress note, have pt complete mODI, add back goals, continue with low back interventions (gentle stretches, STM, postural correction, strengthening), Continue with strengthening and balance exercises, progress HEP,    PT Home Exercise Plan Medbridge Code: [email or web address]    Consulted and Agree with Plan of Care Patient           Patient will benefit from skilled therapeutic intervention in order to improve the following deficits and impairments:  Abnormal gait, Decreased strength, Dizziness, Difficulty walking, Decreased balance, Decreased coordination, Pain  Visit Diagnosis: Unsteadiness on feet  Chronic midline low back pain, unspecified whether sciatica present  Problem List Patient Active Problem List   Diagnosis Date Noted  . Lumbar facet arthropathy 02/24/2018  . Chronic pain syndrome 02/24/2018  . 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 Cognitive deficit as late effect of traumatic brain injury  ([place]) 08/15/2016  . History of traumatic brain injury 06/10/2014  . Osteoarthritis of right knee 06/10/2014   Phillips Grout PT, DPT, GCS  Verlan Grotz 11/27/2019, 12:17 PM  [place] MAIN [facility] SERVICES [address] [place], [place], [address] Phone: [phone]   Fax:  [phone]  Name: Timothy Farley MRN: [id number] Date of Birth: 04/13/1975

## 2019-12-01 ENCOUNTER — Other Ambulatory Visit: Payer: Self-pay

## 2019-12-01 ENCOUNTER — Ambulatory Visit: Payer: Medicare Other

## 2019-12-01 DIAGNOSIS — R2681 Unsteadiness on feet: Secondary | ICD-10-CM

## 2019-12-01 DIAGNOSIS — M545 Low back pain, unspecified: Secondary | ICD-10-CM

## 2019-12-01 NOTE — Therapy (Signed)
[facility] MAIN [facility] SERVICES [address] [place], [place], [address] Phone: [phone]   Fax:  [phone]  Physical Therapy Progress Note/Treatment   Dates of reporting period  10/27/19 to 12/01/19  Patient Details  Name: Timothy Farley MRN: [id number] Date of Birth: [birth year] Referring Provider (PT): Dr. Cherie Ouch   Encounter Date: 12/01/2019   PT End of Session - 12/01/19 1415    Visit Number 10    Number of Visits 17    Date for PT Re-Evaluation 12/22/19    Authorization Type Eval: 10/27/19    PT Start Time 1446    PT Stop Time 1535    PT Time Calculation (min) 49 min    Equipment Utilized During Treatment Gait belt    Activity Tolerance Patient tolerated treatment well    Behavior During Therapy Impulsive           Past Medical History:  Diagnosis Date  . Arthritis   . GERD (gastroesophageal reflux disease)   . Left foot drop   . Pleurisy   . Short-term memory loss   . Subdural hematoma (HCC)   . TBI (traumatic brain injury) (HCC) 1994    Past Surgical History:  Procedure Laterality Date  . BRAIN SURGERY    . brain surgery x3    . ESOPHAGOGASTRODUODENOSCOPY (EGD) WITH PROPOFOL N/A 02/19/2018   Procedure: ESOPHAGOGASTRODUODENOSCOPY (EGD) WITH PROPOFOL;  Surgeon: Toledo, Boykin Nearing, MD;  Location: [facility] ENDOSCOPY;  Service: Gastroenterology;  Laterality: N/A;  . GASTROSTOMY W/ FEEDING TUBE    . trachostemy      There were no vitals filed for this visit.   Subjective Assessment - 12/01/19 1357    Subjective Patient reports his back continues to cause him discomfort. He rates his pain as a 7/10. No new falls since last therapy session. He would like therapy session to focus on his back. No specific questions or concerns at this time    Pertinent History Patient is a 44 year old male with history of falling/unbalance. He notes that the room is always spinning and he has to hold onto his mom's shirt/arm when walking. He  has had 3 falls in the last few months. One of which was taking a step backwards, another forward, and the third his L foot got stuck and he fell forward. He notes that his L knee buckles. He had a TBI when he was younger, and is L side was affected most. He has back pain that goes down his L leg. He used to get Cortizone shots before his insurance stopped paying for them. His goal is to walk down the street confidently without holding on to anything. He notes that he does not want to walk with a cane or walker. 11/26/19: Pt complains of low back pain that has been going on for multiple years, since his mid/late 30s. Initially pain progressively worsened but over the last couple years has stayed relatively consistent. No history of injury, trauma, or surgery. He was seen by Dr. Yves Dill with physiatry who was going to do spinal injections but due to patient's anxiety regarding being able to lay safely on his narrow exam table pt was instead referred him to pain management. He has had plain film radiographs of his lumbar spine from [facility] dated 12/24/2017 which showed mild degenerative findings from L3-S1 most significantly at L5-S1 and facet degenerative changes at L5-S1. No acute findings. Pt underwent 2 rounds of spinal injections with pain management which  were helpful for his pain but his insurance wouldn't authorize any additional injections. At the time pt was unwilling to try physical therapy for his back pain however he is interested in pursuing conservative therapy management at this time. He has a remote history of a traumatic brain injury which caused ataxia and weakness to the left side of his body including L foot drop. He is currently being treated at this clinic for his imbalance and would like to include treatment for his low back pain.    Limitations House hold activities;Walking    Patient Stated Goals Walk down the street confidently without holding on to anything    Currently in Pain?  Yes    Pain Score 7     Pain Location Back    Pain Orientation Right;Left;Lower    Pain Descriptors / Indicators Aching;Throbbing    Pain Type Chronic pain    Pain Onset More than a month ago    Pain Frequency Intermittent               TREATMENT   Manual Therapy NuStep L2 x 5 minutes for warm-up with moist heat pack to lumbar spine (unbilled); Supine single knee to chest, FABER, FADIR, distal hamstring, and proximal hamstring stretches x 30s hold each bilateral; L1-L5 CPA, grade I-II, 20s/bout x 2 bouts on each level, pt with tenderness and guarding today; STM to L lumbar paraspinals with effleurage as well as trigger point release long duration holds;  Therapist helped pt complete mODI = 42% (unbilled);   E-stim NMES applied to posterior lumbar spine withContinuum unit using large muscle spasm setting and large pads at pt tolerable intensityto reduce muscle tone (11)x . Moist heat pack applied to lumbar spine during estim. Pt denies any decrease in has pain following estim.   Pt educated throughout session about proper posture and technique with exercises. Improved exercise technique, movement at target joints, use of target muscles after min to mod verbal, visual, tactile cues.    Patient motivated and participated well within session. Given that today is patient's first follow-up after his low back assessment outcome measures for balance deferred to next session and low back interventions initiated.  Patient is very tender to palpation along lower lumbar spine in particular on the left side.  He only tolerates grade 1 mobilizations to lumbar spine due to tenderness.  Patient reports no change in pain with e-stim.  After e-stim and manual interventions however upon standing patient reports almost complete resolution of his pain.  We will perform balance outcome measures and update goals at next session.  Patient denies any improvement in his balance however  both mom and therapist have noted improvement in patient's confidence as well as lower extremity strength and gait. Patient would benefit from additional skilled PT intervention to improve strength, balance and mobility. Ptwill benefit from PT services to address deficits in headachesin order to return to full function at home.                         PT Short Term Goals - 12/01/19 1458      PT SHORT TERM GOAL #1   Title Pt will be independent with HEP in order to improve strength and balance in order to decrease fall risk and improve function at home and work.    Baseline 10/27/19: HEP given    Time 4    Period Weeks    Status On-going    Target  Date 11/24/19      PT SHORT TERM GOAL #2   Title Patient will be able to perform sit to stands without UE support in order to demonstrate increased functional strength of LEs.    Baseline 10/27/19: Patient unable to perform sit to stands without UE support or higher chair; 12/01/19: Progressing ability to perform sit to stands from lower heights;    Time 4    Period Weeks    Status Partially Met    Target Date 11/24/19             PT Long Term Goals - 12/01/19 1459      PT LONG TERM GOAL #1   Title Pt will improve BERG by at least 3 points in order to demonstrate clinically significant improvement in balance.    Baseline 10/27/19: 41/56    Time 8    Period Weeks    Status Deferred    Target Date 12/22/19      PT LONG TERM GOAL #2   Title Pt will perform 5TSTS in less than 12 seconds in order to decrease risk for falls.    Baseline 10/27/19: Unable to perform due to UE support; 12/01/19: Still requires elevated surface but height is lowering    Time 8    Period Weeks    Status Partially Met    Target Date 12/22/19      PT LONG TERM GOAL #3   Title Pt will improve ABC by at least 13% in order to demonstrate clinically significant improvement in balance confidence.    Baseline 10/27/19: 51.25%    Time 8    Period  Weeks    Status Deferred    Target Date 12/22/19      PT LONG TERM GOAL #4   Title Patient will increase FOTO score to equal to or greater than 55 to demonstrate statistically significant improvement in mobility and quality of life    Baseline 10/27/19: 50    Time 8    Period Weeks    Status Deferred    Target Date 12/22/19      PT LONG TERM GOAL #5   Title Patient will improve 10MWT self-selected pace to 1.49m/s in order to be considered a full community ambulator.    Baseline 10/27/19: self-selected: 0.85 m/s, fastest: 1.60m/s    Time 8    Period Weeks    Status Deferred    Target Date 12/22/19                 Plan - 12/01/19 1416    Clinical Impression Statement Patient motivated and participated well within session. Given that today is patient's first follow-up after his low back assessment outcome measures for balance deferred to next session and low back interventions initiated.  Patient is very tender to palpation along lower lumbar spine in particular on the left side.  He only tolerates grade 1 mobilizations to lumbar spine due to tenderness.  Patient reports no change in pain with e-stim.  After e-stim and manual interventions however upon standing patient reports almost complete resolution of his pain.  We will perform balance outcome measures and update goals at next session.  Patient denies any improvement in his balance however both mom and therapist have noted improvement in patient's confidence as well as lower extremity strength and gait. Patient would benefit from additional skilled PT intervention to improve strength, balance and mobility. Pt will benefit from PT services to address deficits in headaches in order  to return to full function at home.    Personal Factors and Comorbidities Comorbidity 3+;Time since onset of injury/illness/exacerbation;Past/Current Experience    Comorbidities TBI, GERD, cognitive deficits, back pain    Examination-Activity Limitations  Locomotion Level;Reach Overhead;Bathing;Stairs;Caring for Others    Examination-Participation Restrictions Driving;Community Activity;Meal Prep;Occupation;Volunteer;Yard Work    [employer] Evolving/Moderate complexity    Rehab Potential Fair    PT Frequency 2x / week    PT Duration 8 weeks    PT Treatment/Interventions ADLs/Self Care Home Management;Aquatic Therapy;Canalith Repostioning;Gait training;Stair training;Functional mobility training;Therapeutic activities;Balance training;Neuromuscular re-education;Therapeutic exercise;Patient/family education;Dry needling;Vestibular;Spinal Manipulations;Joint Manipulations;Electrical Stimulation;Cryotherapy;Passive range of motion;Traction;Moist Heat;Ultrasound;Cognitive remediation;Manual techniques    PT Next Visit Plan Update additional outcome measures/goals, continue with low back interventions (gentle stretches, STM, postural correction, strengthening), Continue with strengthening and balance exercises, progress HEP,    PT Home Exercise Plan Medbridge Code: [email or web address]    Consulted and Agree with Plan of Care Patient           Patient will benefit from skilled therapeutic intervention in order to improve the following deficits and impairments:  Abnormal gait, Decreased strength, Dizziness, Difficulty walking, Decreased balance, Decreased coordination, Pain  Visit Diagnosis: Unsteadiness on feet  Chronic midline low back pain, unspecified whether sciatica present     Problem List Patient Active Problem List   Diagnosis Date Noted  . Lumbar facet arthropathy 02/24/2018  . Chronic pain syndrome 02/24/2018  . 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 History of traumatic brain injury 06/10/2014  . Osteoarthritis of right knee 06/10/2014    Phillips Grout PT, DPT, GCS  Graclynn Vanantwerp 12/02/2019, 8:14 AM  [place] MAIN  [facility] SERVICES [address] [place], [place], [address] Phone: [phone]   Fax:  [phone]  Name: Timothy Farley MRN: [id number] Date of Birth: 09-06-1975

## 2019-12-03 ENCOUNTER — Ambulatory Visit: Payer: Medicare Other

## 2019-12-03 ENCOUNTER — Other Ambulatory Visit: Payer: Self-pay

## 2019-12-03 DIAGNOSIS — M545 Low back pain, unspecified: Secondary | ICD-10-CM

## 2019-12-03 DIAGNOSIS — R2681 Unsteadiness on feet: Secondary | ICD-10-CM | POA: Diagnosis not present

## 2019-12-03 NOTE — Therapy (Signed)
[place] MAIN [facility] SERVICES [address] [place], [place], [address] Phone: [phone]   Fax:  [phone]  Physical Therapy Treatment/Goal Update  Patient Details  Name: Timothy Farley MRN: [id number] Date of Birth: 1975/12/05 Referring Provider (PT): Dr. 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   Subjective Assessment - 12/03/19 1509    Subjective Patient reports that he had a few hours of complete resolution of back pain after last therapy session. However pain returned to his baseline after a few hours. He rates his pain as a 7/10 upon arrival today. No new falls since last therapy session. He would like to continue working on his back pain. No specific questions or concerns at this time    Pertinent History Patient is a 44 year old male with history of falling/unbalance. He notes that the room is always  spinning and he has to hold onto his mom's shirt/arm when walking. He has had 3 falls in the last few months. One of which was taking a step backwards, another forward, and the third his L foot got stuck and he fell forward. He notes that his L knee buckles. He had a TBI when he was younger, and is L side was affected most. He has back pain that goes down his L leg. He used to get Cortizone shots before his insurance stopped paying for them. His goal is to walk down the street confidently without holding on to anything. He notes that he does not want to walk with a cane or walker. 11/26/19: Pt complains of low back pain that has been going on for multiple years, since his mid/late 64s. Initially pain progressively worsened but over the last couple years has stayed relatively consistent. No history of injury, trauma, or surgery. He was seen by Dr. Sharlet Salina with physiatry who was going to do spinal injections but due to patient's anxiety regarding being able to lay safely on his narrow exam table pt was instead referred him to pain management. He has had plain film radiographs of his lumbar spine from [facility] dated 12/24/2017 which showed mild degenerative findings from L3-S1 most significantly at L5-S1 and facet degenerative changes at L5-S1. No acute  findings. Pt underwent 2 rounds of spinal injections with pain management which were helpful for his pain but his insurance wouldn't authorize any additional injections. At the time pt was unwilling to try physical therapy for his back pain however he is interested in pursuing conservative therapy management at this time. He has a remote history of a traumatic brain injury which caused ataxia and weakness to the left side of his body including L foot drop. He is currently being treated at this clinic for his imbalance and would like to include treatment for his low back pain.    Limitations House hold activities;Walking    Patient Stated Goals Walk down the  street confidently without holding on to anything    Currently in Pain? Yes    Pain Score 7     Pain Location Back    Pain Orientation Right;Left;Lower    Pain Descriptors / Indicators Aching;Throbbing    Pain Type Chronic pain    Pain Onset More than a month ago    Pain Frequency Intermittent              [facility] PT Assessment - 12/03/19 1602      Berg Balance Test   Sit to Stand Able to stand  independently using hands    Standing Unsupported Able to stand safely 2 minutes    Sitting with Back Unsupported but Feet Supported on Floor or Stool Able to sit safely and securely 2 minutes    Stand to Sit Controls descent by using hands    Transfers Able to transfer safely, definite need of hands    Standing Unsupported with Eyes Closed Able to stand 10 seconds with supervision    Standing Unsupported with Feet Together Able to place feet together independently and stand 1 minute safely    From Standing, Reach Forward with Outstretched Arm Can reach forward >12 cm safely (5")    From Standing Position, Pick up Object from Floor Able to pick up shoe safely and easily    From Standing Position, Turn to Look Behind Over each Shoulder Looks behind one side only/other side shows less weight shift    Turn 360 Degrees Able to turn 360 degrees safely but slowly    Standing Unsupported, Alternately Place Feet on Step/Stool Able to complete >2 steps/needs minimal assist    Standing Unsupported, One Foot in Front Able to take small step independently and hold 30 seconds    Standing on One Leg Tries to lift leg/unable to hold 3 seconds but remains standing independently    Total Score 40            TREATMENT   Neuromuscular Re-education  Pt completed ABC (31.25%) and FOTO (36) Performed BERG with patient who scored: 47m Gait speed: self-selected: 10.2s = 0.35m/s, fastest:6.7s = 1.84m/s   Manual Therapy NuStep L2 x 5 minutes for warm-up with moist heat pack to lumbar spine  (unbilled); L1-L5 CPA, grade I-II, 20s/bout x 2 bouts on each level, pt with tenderness and guarding today; STM to L lumbar paraspinals with effleurage as well as trigger point release long duration holds;  NMES applied to posterior lumbar spine during manual therapy withContinuum unit using large muscle spasm setting and large pads at pt tolerable intensityto reduce muscle tone (16)x 33minutes (unbilled);   Pt educated throughout session about proper posture and technique with exercises. Improved exercise technique, movement at target joints, use of target muscles after min to mod verbal, visual, tactile cues.  Patient motivated and participated well within session. Balance outcome measures updated during the session given that they were not able to be completed during last session. Self-reported outcome measures such as Foto and ABC both worsened. Patient denies any improvement in his balance however both mom and therapist have noted improvement in patient's confidence as well as lower extremity strength and gait.  Patient's self-selected 10 m gait speed has improved. His BERG remains unchanged. Patient reports that he would like to focus the next few sessions on his back pain. He had good resolution of pain after last session and only a short period of today session was able to be dedicated to working on his back. We will focus more on his back pain and follow-up sessions. Patient would benefit from additional skilled PT intervention to improve strength, balance and mobility. Ptwill benefit from PT services to address deficits in headachesin order to return to full function at home.                          PT Short Term Goals - 12/01/19 1458      PT SHORT TERM GOAL #1   Title Pt will be independent with HEP in order to improve strength and balance in order to decrease fall risk and improve function at home and work.    Baseline 10/27/19: HEP given    Time 4     Period Weeks    Status On-going    Target Date 11/24/19      PT SHORT TERM GOAL #2   Title Patient will be able to perform sit to stands without UE support in order to demonstrate increased functional strength of LEs.    Baseline 10/27/19: Patient unable to perform sit to stands without UE support or higher chair; 12/01/19: Progressing ability to perform sit to stands from lower heights;    Time 4    Period Weeks    Status Partially Met    Target Date 11/24/19             PT Long Term Goals - 12/03/19 1510      PT LONG TERM GOAL #1   Title Pt will improve BERG by at least 3 points in order to demonstrate clinically significant improvement in balance.    Baseline 10/27/19: 41/56; 12/03/19: 40/56    Time 8    Period Weeks    Status On-going    Target Date 12/22/19      PT LONG TERM GOAL #2   Title Pt will perform 5TSTS in less than 12 seconds in order to decrease risk for falls.    Baseline 10/27/19: Unable to perform due to UE support; 12/01/19: Still requires elevated surface but height is lowering;    Time 8    Period Weeks    Status Partially Met    Target Date 12/22/19      PT LONG TERM GOAL #3   Title Pt will improve ABC by at least 13% in order to demonstrate clinically significant improvement in balance confidence.    Baseline 10/27/19: 51.25%; 12/03/19: 31.25%;    Time 8    Period Weeks    Status On-going    Target Date 12/22/19      PT LONG TERM GOAL #4   Title Patient will increase FOTO score to equal to or greater than 55 to demonstrate statistically significant improvement in mobility and quality of life    Baseline 10/27/19: 50; 12/03/19:  36    Time 8    Period Weeks    Status On-going    Target Date 12/22/19      PT LONG TERM GOAL #5   Title Patient will improve 10MWT self-selected pace to 1.41m/s in order to be considered a full community ambulator.    Baseline 10/27/19: self-selected: 0.85 m/s, fastest: 1.89m/s; 12/03/19: self-selected: 10.2s = 0.49m/s, fastest:  6.7s = 1.1m/s    Time 8    Period Weeks    Status Partially Met    Target Date 12/22/19      Additional Long Term Goals   Additional Long Term Goals Yes      PT LONG TERM GOAL #6   Title Pt will decrease mODI scoreby at least 13 points in order demonstrate clinically significant reduction in pain/disability    Baseline 12/03/19: 42%    Time 8    Period Weeks    Status New    Target Date 12/22/19                 Plan - 12/03/19 1510    Clinical Impression Statement Patient motivated and participated well within session. Balance outcome measures updated during the session given that they were not able to be completed during last session. Self-reported outcome measures such as Foto and ABC both worsened. Patient denies any improvement in his balance however both mom and therapist have noted improvement in patient's confidence as well as lower extremity strength and gait.  Patient's self-selected 10 m gait speed has improved. His BERG remains unchanged. Patient reports that he would like to focus the next few sessions on his back pain. He had good resolution of pain after last session and only a short period of today session was able to be dedicated to working on his back. We will focus more on his back pain and follow-up sessions. Patient would benefit from additional skilled PT intervention to improve strength, balance and mobility. Pt will benefit from PT services to address deficits in headaches in order to return to full function at home.    Personal Factors and Comorbidities Comorbidity 3+;Time since onset of injury/illness/exacerbation;Past/Current Experience    Comorbidities TBI, GERD, cognitive deficits, back pain    Examination-Activity Limitations Locomotion Level;Reach Overhead;Bathing;Stairs;Caring for Others    Examination-Participation Restrictions Driving;Community Activity;Meal Prep;Occupation;Volunteer;Yard Work    [employer] Evolving/Moderate  complexity    Rehab Potential Fair    PT Frequency 2x / week    PT Duration 8 weeks    PT Treatment/Interventions ADLs/Self Care Home Management;Aquatic Therapy;Canalith Repostioning;Gait training;Stair training;Functional mobility training;Therapeutic activities;Balance training;Neuromuscular re-education;Therapeutic exercise;Patient/family education;Dry needling;Vestibular;Spinal Manipulations;Joint Manipulations;Electrical Stimulation;Cryotherapy;Passive range of motion;Traction;Moist Heat;Ultrasound;Cognitive remediation;Manual techniques    PT Next Visit Plan Update additional outcome measures/goals, continue with low back interventions (gentle stretches, STM, postural correction, strengthening), Continue with strengthening and balance exercises, progress HEP,    PT Home Exercise Plan Medbridge Code: [id number]    Consulted and Agree with Plan of Care Patient           Patient will benefit from skilled therapeutic intervention in order to improve the following deficits and impairments:  Abnormal gait, Decreased strength, Dizziness, Difficulty walking, Decreased balance, Decreased coordination, Pain  Visit Diagnosis: Unsteadiness on feet  Chronic midline low back pain, unspecified whether sciatica present     Problem List Patient Active Problem List   Diagnosis Date Noted   Lumbar facet arthropathy 02/24/2018   Chronic pain syndrome 02/24/2018   Ataxia 08/15/2016   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

## 2019-12-08 ENCOUNTER — Other Ambulatory Visit: Payer: Self-pay

## 2019-12-08 ENCOUNTER — Ambulatory Visit: Payer: Medicare Other

## 2019-12-08 DIAGNOSIS — R2681 Unsteadiness on feet: Secondary | ICD-10-CM

## 2019-12-08 DIAGNOSIS — M545 Low back pain, unspecified: Secondary | ICD-10-CM

## 2019-12-08 NOTE — Therapy (Signed)
[place] MAIN [facility] SERVICES [address] [place], [place], [address] Phone: [phone]   Fax:  [phone]  Physical Therapy Treatment  Patient Details  Name: Timothy Farley MRN: [id number] Date of Birth: January 29, 1976 Referring Provider (PT): Dr. Bethann Punches   Encounter Date: 12/08/2019   PT End of Session - 12/08/19 1407    Visit Number 12    Number of Visits 17    Date for PT Re-Evaluation 12/22/19    Authorization Type Eval: 10/27/19    PT Start Time 1347    PT Stop Time 1435    PT Time Calculation (min) 48 min    Equipment Utilized During Treatment Gait belt    Activity Tolerance Patient tolerated treatment well    Behavior During Therapy Impulsive           Past Medical History:  Diagnosis Date  . Arthritis   . GERD (gastroesophageal reflux disease)   . Left foot drop   . Pleurisy   . Short-term memory loss   . Subdural hematoma ([place])   . TBI (traumatic brain injury) ([place]) 1994    Past Surgical History:  Procedure Laterality Date  . BRAIN SURGERY    . brain surgery x3    . ESOPHAGOGASTRODUODENOSCOPY (EGD) WITH PROPOFOL N/A 02/19/2018   Procedure: ESOPHAGOGASTRODUODENOSCOPY (EGD) WITH PROPOFOL;  Surgeon: Toledo, Benay Pike, MD;  Location: [facility] ENDOSCOPY;  Service: Gastroenterology;  Laterality: N/A;  . GASTROSTOMY W/ FEEDING TUBE    . trachostemy      There were no vitals filed for this visit.   Subjective Assessment - 12/08/19 1351    Subjective Patient reports that he had some relief of his back pain after his last therapy session but it didn't last as long as the first time we worked on his back. He rates his pain as a 6/10 upon arrival today. No new falls since last therapy session. He would like to continue working on his back pain. No specific questions or concerns at this time.    Pertinent History Patient is a 44 year old male with history of falling/unbalance. He notes that the room is always spinning and he has  to hold onto his mom's shirt/arm when walking. He has had 3 falls in the last few months. One of which was taking a step backwards, another forward, and the third his L foot got stuck and he fell forward. He notes that his L knee buckles. He had a TBI when he was younger, and is L side was affected most. He has back pain that goes down his L leg. He used to get Cortizone shots before his insurance stopped paying for them. His goal is to walk down the street confidently without holding on to anything. He notes that he does not want to walk with a cane or walker. 11/26/19: Pt complains of low back pain that has been going on for multiple years, since his mid/late 59s. Initially pain progressively worsened but over the last couple years has stayed relatively consistent. No history of injury, trauma, or surgery. He was seen by Dr. Sharlet Salina with physiatry who was going to do spinal injections but due to patient's anxiety regarding being able to lay safely on his narrow exam table pt was instead referred him to pain management. He has had plain film radiographs of his lumbar spine from [facility] clinic dated 12/24/2017 which showed mild degenerative findings from L3-S1 most significantly at L5-S1 and facet degenerative changes at L5-S1.  No acute findings. Pt underwent 2 rounds of spinal injections with pain management which were helpful for his pain but his insurance wouldn't authorize any additional injections. At the time pt was unwilling to try physical therapy for his back pain however he is interested in pursuing conservative therapy management at this time. He has a remote history of a traumatic brain injury which caused ataxia and weakness to the left side of his body including L foot drop. He is currently being treated at this clinic for his imbalance and would like to include treatment for his low back pain.    Limitations House hold activities;Walking    Patient Stated Goals Walk down the street confidently  without holding on to anything    Currently in Pain? Yes    Pain Score 6     Pain Location Back    Pain Orientation Right;Left    Pain Descriptors / Indicators Aching;Throbbing    Pain Type Chronic pain    Pain Onset More than a month ago    Aggravating Factors  rolling in bed, transfers after extended sitting, most aggravation from forward flexion (bending over)    Pain Relieving Factors laying down on back otherwise pt doesn't know any additional relieving factors               TREATMENT   Manual Therapy NuStep L2 x 5 minutes for warm-up (unbilled); L1-L5 CPA, grade I-II, 20s/bout x 2 bouts on each level, pt with less tenderness and guarding today compared to last session; L1-L5 L UPA grade I-II, 20s/bout x 2 bouts on each level; STM to L lumbar paraspinals with effleurage as well as trigger point release long duration holds;  Supine single knee to chest, FABER, FADIR, distal hamstring, proximal hamstring, and thoracolumbar rotation stretches x 30s hold each bilateral; Discussed possibly using dry needling at next session to help with back pain;   E-stim NMES applied to posterior lumbar spine withContinuum unit using large muscle spasm setting and large pads at pt tolerable intensityto reduce muscle tone(level 14 initially increased to 16 with 4 minutes left)x 42mnutes. Pt in prone positioning to improve lumbar extension. Moist heat pack applied to lumbar spine during estim.    Patient motivated and participated well within session. Patient is less tender to palpation along lower lumbar spine today during session compared to previous sessions. He tolerates up to grade II mobilizations of lumbar spine today.   After e-stim and manual interventions however upon standing patient reports almost complete resolution of his pain. He states that it is maybe a "0.5/10."    Education provided to patient and mother regarding the possible benefits of dry needling in future  sessions.  Patient is in agreement to attempt dry needling to see if it helps with his pain. He would benefit from additional skilled PT intervention to improve strength, balance and mobility as well as back pain.Ptwill benefit from PT services to address deficits in headachesin order to return to full function at home with less pain.                         PT Short Term Goals - 12/01/19 1458      PT SHORT TERM GOAL #1   Title Pt will be independent with HEP in order to improve strength and balance in order to decrease fall risk and improve function at home and work.    Baseline 10/27/19: HEP given    Time 4  Period Weeks    Status On-going    Target Date 11/24/19      PT SHORT TERM GOAL #2   Title Patient will be able to perform sit to stands without UE support in order to demonstrate increased functional strength of LEs.    Baseline 10/27/19: Patient unable to perform sit to stands without UE support or higher chair; 12/01/19: Progressing ability to perform sit to stands from lower heights;    Time 4    Period Weeks    Status Partially Met    Target Date 11/24/19             PT Long Term Goals - 12/03/19 1510      PT LONG TERM GOAL #1   Title Pt will improve BERG by at least 3 points in order to demonstrate clinically significant improvement in balance.    Baseline 10/27/19: 41/56; 12/03/19: 40/56    Time 8    Period Weeks    Status On-going    Target Date 12/22/19      PT LONG TERM GOAL #2   Title Pt will perform 5TSTS in less than 12 seconds in order to decrease risk for falls.    Baseline 10/27/19: Unable to perform due to UE support; 12/01/19: Still requires elevated surface but height is lowering;    Time 8    Period Weeks    Status Partially Met    Target Date 12/22/19      PT LONG TERM GOAL #3   Title Pt will improve ABC by at least 13% in order to demonstrate clinically significant improvement in balance confidence.    Baseline 10/27/19:  51.25%; 12/03/19: 31.25%;    Time 8    Period Weeks    Status On-going    Target Date 12/22/19      PT LONG TERM GOAL #4   Title Patient will increase FOTO score to equal to or greater than 55 to demonstrate statistically significant improvement in mobility and quality of life    Baseline 10/27/19: 50; 12/03/19: 36    Time 8    Period Weeks    Status On-going    Target Date 12/22/19      PT LONG TERM GOAL #5   Title Patient will improve 10MWT self-selected pace to 1.23ms in order to be considered a full community ambulator.    Baseline 10/27/19: self-selected: 0.85 m/s, fastest: 1.515m; 12/03/19: self-selected: 10.2s = 0.9871m fastest: 6.7s = 1.82m39m  Time 8    Period Weeks    Status Partially Met    Target Date 12/22/19      Additional Long Term Goals   Additional Long Term Goals Yes      PT LONG TERM GOAL #6   Title Pt will decrease mODI scoreby at least 13 points in order demonstrate clinically significant reduction in pain/disability    Baseline 12/03/19: 42%    Time 8    Period Weeks    Status New    Target Date 12/22/19                 Plan - 12/08/19 1408    Clinical Impression Statement Patient motivated and participated well within session. Patient is less tender to palpation along lower lumbar spine today during session compared to previous sessions. He tolerates up to grade II mobilizations of lumbar spine today.   After e-stim and manual interventions however upon standing patient reports almost complete resolution of his pain. He states  that it is maybe a "0.5/10."    Education provided to patient and mother regarding the possible benefits of dry needling in future sessions.  Patient is in agreement to attempt dry needling to see if it helps with his pain. He would benefit from additional skilled PT intervention to improve strength, balance and mobility as well as back pain. Pt will benefit from PT services to address deficits in headaches in order to return to  full function at home with less pain.    Personal Factors and Comorbidities Comorbidity 3+;Time since onset of injury/illness/exacerbation;Past/Current Experience    Comorbidities TBI, GERD, cognitive deficits, back pain    Examination-Activity Limitations Locomotion Level;Reach Overhead;Bathing;Stairs;Caring for Others    Examination-Participation Restrictions Driving;Community Activity;Meal Prep;Occupation;Volunteer;Yard Work    [employer] officer Evolving/Moderate complexity    Rehab Potential Fair    PT Frequency 2x / week    PT Duration 8 weeks    PT Treatment/Interventions ADLs/Self Care Home Management;Aquatic Therapy;Canalith Repostioning;Gait training;Stair training;Functional mobility training;Therapeutic activities;Balance training;Neuromuscular re-education;Therapeutic exercise;Patient/family education;Dry needling;Vestibular;Spinal Manipulations;Joint Manipulations;Electrical Stimulation;Cryotherapy;Passive range of motion;Traction;Moist Heat;Ultrasound;Cognitive remediation;Manual techniques    PT Next Visit Plan continue with low back interventions (gentle stretches, STM, postural correction, strengthening), Continue with strengthening and balance exercises, progress HEP,    PT Home Exercise Plan Medbridge Code: [email or web address]    Consulted and Agree with Plan of Care Patient           Patient will benefit from skilled therapeutic intervention in order to improve the following deficits and impairments:  Abnormal gait, Decreased strength, Dizziness, Difficulty walking, Decreased balance, Decreased coordination, Pain  Visit Diagnosis: Unsteadiness on feet  Chronic midline low back pain, unspecified whether sciatica present     Problem List Patient Active Problem List   Diagnosis Date Noted  . Lumbar facet arthropathy 02/24/2018  . Chronic pain syndrome 02/24/2018  . 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 History of traumatic brain injury 06/10/2014  . Osteoarthritis of right knee 06/10/2014   Phillips Grout PT, DPT, GCS  Colvin Blatt 12/08/2019, 2:54 PM  [place] MAIN [facility] SERVICES [address] [place], [place], [address] Phone: [phone]   Fax:  [phone]  Name: Timothy Farley MRN: [id number] Date of Birth: 07/28/1975

## 2019-12-10 ENCOUNTER — Ambulatory Visit: Payer: Medicare Other

## 2019-12-10 ENCOUNTER — Other Ambulatory Visit: Payer: Self-pay

## 2019-12-10 DIAGNOSIS — R2681 Unsteadiness on feet: Secondary | ICD-10-CM | POA: Diagnosis not present

## 2019-12-10 DIAGNOSIS — G8929 Other chronic pain: Secondary | ICD-10-CM

## 2019-12-10 NOTE — Therapy (Signed)
[place] MAIN [facility] SERVICES [address] [place], [place], [address] Phone: [phone]   Fax:  [phone]  Physical Therapy Treatment  Patient Details  Name: Timothy Farley MRN: [id number] Date of Birth: 1975/03/22 Referring Provider (PT): Dr. Bethann Punches   Encounter Date: 12/10/2019   PT End of Session - 12/10/19 1526    Visit Number 13    Number of Visits 17    Date for PT Re-Evaluation 12/22/19    Authorization Type Eval: 10/27/19    PT Start Time 1517    PT Stop Time 1600    PT Time Calculation (min) 43 min    Equipment Utilized During Treatment Gait belt    Activity Tolerance Patient tolerated treatment well    Behavior During Therapy Impulsive           Past Medical History:  Diagnosis Date   Arthritis    GERD (gastroesophageal reflux disease)    Left foot drop    Pleurisy    Short-term memory loss    Subdural hematoma ([place])    TBI (traumatic brain injury) ([place]) 1994    Past Surgical History:  Procedure Laterality Date   BRAIN SURGERY     brain surgery x3     ESOPHAGOGASTRODUODENOSCOPY (EGD) WITH PROPOFOL N/A 02/19/2018   Procedure: ESOPHAGOGASTRODUODENOSCOPY (EGD) WITH PROPOFOL;  Surgeon: Toledo, Benay Pike, MD;  Location: [facility] ENDOSCOPY;  Service: Gastroenterology;  Laterality: N/A;   GASTROSTOMY W/ FEEDING TUBE     trachostemy      There were no vitals filed for this visit.   Subjective Assessment - 12/10/19 1512    Subjective Patient reports that he had some relief of his back pain again after his last therapy session and it lasted longer this time than previous sessions. He rates his pain as a 5-6/10 upon arrival today. No new falls since last therapy session. He would like to continue working on his back pain. No specific questions or concerns at this time.    Pertinent History Patient is a 44 year old male with history of falling/unbalance. He notes that the room is always spinning and he has to  hold onto his mom's shirt/arm when walking. He has had 3 falls in the last few months. One of which was taking a step backwards, another forward, and the third his L foot got stuck and he fell forward. He notes that his L knee buckles. He had a TBI when he was younger, and is L side was affected most. He has back pain that goes down his L leg. He used to get Cortizone shots before his insurance stopped paying for them. His goal is to walk down the street confidently without holding on to anything. He notes that he does not want to walk with a cane or walker. 11/26/19: Pt complains of low back pain that has been going on for multiple years, since his mid/late 15s. Initially pain progressively worsened but over the last couple years has stayed relatively consistent. No history of injury, trauma, or surgery. He was seen by Dr. Sharlet Salina with physiatry who was going to do spinal injections but due to patient's anxiety regarding being able to lay safely on his narrow exam table pt was instead referred him to pain management. He has had plain film radiographs of his lumbar spine from [facility] clinic dated 12/24/2017 which showed mild degenerative findings from L3-S1 most significantly at L5-S1 and facet degenerative changes at L5-S1. No acute findings. Pt underwent  2 rounds of spinal injections with pain management which were helpful for his pain but his insurance wouldn't authorize any additional injections. At the time pt was unwilling to try physical therapy for his back pain however he is interested in pursuing conservative therapy management at this time. He has a remote history of a traumatic brain injury which caused ataxia and weakness to the left side of his body including L foot drop. He is currently being treated at this clinic for his imbalance and would like to include treatment for his low back pain.    Limitations House hold activities;Walking    Patient Stated Goals Walk down the street confidently without  holding on to anything    Currently in Pain? Yes    Pain Score 6     Pain Location Back    Pain Orientation Right;Left    Pain Descriptors / Indicators Aching;Throbbing    Pain Type Chronic pain    Pain Onset More than a month ago               TREATMENT   Manual Therapy NuStep L2 x 5 minutes for warm-up (unbilled); L1-L5CPA, grade I-II, 20s/bout x 2boutson each level,pt withless tenderness and guarding today compared to last session; L1-L5L UPA grade I-II, 20s/bout x 2boutson each level; STM toL lumbar paraspinals with effleurage as well as trigger point release long duration holds; Supine single knee to chest, FABER, FADIR, distal hamstring, proximal hamstring, and thoracolumbar rotation stretches x 30s hold each bilateral;   E-stim NMES applied to posteriorlumbar spinewithContinuum unit usinglargemuscle spasm setting and large padsat pt tolerable intensityto reduce muscle tone(level 21)x . Pt in prone positioning to improve lumbar extension. Moist heat pack applied tolumbar spine during estim.    Trigger Point Dry Needling (TDN), unbilled Education performed with patient regarding potential benefit of TDN. Reviewed precautions and risks with patient and mother. Extensive time spent with pt to ensure full understanding of TDN risks. Pt and mother provided verbal consent to treatment. TDN performed to L lumbar multifidi with 2, 0.30 x 75 single needle placements with deep ache reported by patient. Pistoning technique utilized. Pt denies any increase in pain following dry needling.    Patient motivated and participated well within session.Patient reports a lot of tenderness  After e-stim, manual interventions, and dry needling pt reports 1-2/10 low back pain upon standing. Pt encouraged to note his response to the dry needling to update therapist at next visit. Will continue to work towards more active interventions to manage low back as pain  continues to decline. He would benefit from additional skilled PT intervention to improve strength, balance and mobility as well as back pain.Ptwill benefit from PT services to address deficits in headachesin order to return to full function at home with less pain.                            PT Short Term Goals - 12/01/19 1458      PT SHORT TERM GOAL #1   Title Pt will be independent with HEP in order to improve strength and balance in order to decrease fall risk and improve function at home and work.    Baseline 10/27/19: HEP given    Time 4    Period Weeks    Status On-going    Target Date 11/24/19      PT SHORT TERM GOAL #2   Title Patient will be able to perform  sit to stands without UE support in order to demonstrate increased functional strength of LEs.    Baseline 10/27/19: Patient unable to perform sit to stands without UE support or higher chair; 12/01/19: Progressing ability to perform sit to stands from lower heights;    Time 4    Period Weeks    Status Partially Met    Target Date 11/24/19             PT Long Term Goals - 12/03/19 1510      PT LONG TERM GOAL #1   Title Pt will improve BERG by at least 3 points in order to demonstrate clinically significant improvement in balance.    Baseline 10/27/19: 41/56; 12/03/19: 40/56    Time 8    Period Weeks    Status On-going    Target Date 12/22/19      PT LONG TERM GOAL #2   Title Pt will perform 5TSTS in less than 12 seconds in order to decrease risk for falls.    Baseline 10/27/19: Unable to perform due to UE support; 12/01/19: Still requires elevated surface but height is lowering;    Time 8    Period Weeks    Status Partially Met    Target Date 12/22/19      PT LONG TERM GOAL #3   Title Pt will improve ABC by at least 13% in order to demonstrate clinically significant improvement in balance confidence.    Baseline 10/27/19: 51.25%; 12/03/19: 31.25%;    Time 8    Period Weeks    Status  On-going    Target Date 12/22/19      PT LONG TERM GOAL #4   Title Patient will increase FOTO score to equal to or greater than 55 to demonstrate statistically significant improvement in mobility and quality of life    Baseline 10/27/19: 50; 12/03/19: 36    Time 8    Period Weeks    Status On-going    Target Date 12/22/19      PT LONG TERM GOAL #5   Title Patient will improve self-selected pace to 1.4m/s in order to be considered a full community ambulator.    Baseline 10/27/19: self-selected: 0.85 m/s, fastest: 1.83m/s; 12/03/19: self-selected: 10.2s = 0.57m/s, fastest: 6.7s = 1.8m/s    Time 8    Period Weeks    Status Partially Met    Target Date 12/22/19      Additional Long Term Goals   Additional Long Term Goals Yes      PT LONG TERM GOAL #6   Title Pt will decrease mODI scoreby at least 13 points in order demonstrate clinically significant reduction in pain/disability    Baseline 12/03/19: 42%    Time 8    Period Weeks    Status New    Target Date 12/22/19                 Plan - 12/10/19 1526    Clinical Impression Statement Patient motivated and participated well within session. Patient reports a lot of tenderness   After e-stim, manual interventions, and dry needling pt reports 1-2/10 low back pain upon standing. Pt encouraged to note his response to the dry needling to update therapist at next visit. Will continue to work towards more active interventions to manage low back as pain continues to decline. He would benefit from additional skilled PT intervention to improve strength, balance and mobility as well as back pain. Pt will benefit from PT  services to address deficits in headaches in order to return to full function at home with less pain.    Personal Factors and Comorbidities Comorbidity 3+;Time since onset of injury/illness/exacerbation;Past/Current Experience    Comorbidities TBI, GERD, cognitive deficits, back pain    Examination-Activity Limitations  Locomotion Level;Reach Overhead;Bathing;Stairs;Caring for Others    Examination-Participation Restrictions Driving;Community Activity;Meal Prep;Occupation;Volunteer;Yard Work    [employer]chant [employer] officer Evolving/Moderate complexity    Rehab Potential Fair    PT Frequency 2x / week    PT Duration 8 weeks    PT Treatment/Interventions ADLs/Self Care Home Management;Aquatic Therapy;Canalith Repostioning;Gait training;Stair training;Functional mobility training;Therapeutic activities;Balance training;Neuromuscular re-education;Therapeutic exercise;Patient/family education;Dry needling;Vestibular;Spinal Manipulations;Joint Manipulations;Electrical Stimulation;Cryotherapy;Passive range of motion;Traction;Moist Heat;Ultrasound;Cognitive remediation;Manual techniques    PT Next Visit Plan continue with low back interventions (gentle stretches, STM, postural correction, strengthening), Continue with strengthening and balance exercises, progress HEP,    PT Home Exercise Plan Medbridge Code: [email or web address]    Consulted and Agree with Plan of Care Patient           Patient will benefit from skilled therapeutic intervention in order to improve the following deficits and impairments:  Abnormal gait, Decreased strength, Dizziness, Difficulty walking, Decreased balance, Decreased coordination, Pain  Visit Diagnosis: Chronic midline low back pain, unspecified whether sciatica present     Problem List Patient Active Problem List   Diagnosis Date Noted   Lumbar facet arthropathy 02/24/2018   Chronic pain syndrome 02/24/2018   Ataxia 08/15/2016   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 [place], [place], [address] Phone:  [phone]   Fax:  [phone]  Name: Timothy Farley MRN: [id number] Date of Birth: 08/02/1975

## 2019-12-14 ENCOUNTER — Ambulatory Visit: Payer: Medicare Other

## 2019-12-16 ENCOUNTER — Ambulatory Visit: Payer: Medicare Other

## 2019-12-20 NOTE — Patient Instructions (Addendum)
Access Code: [email or web address] URL: [email or web address] Date: 12/21/2019 Prepared by: Roxana Hires  Exercises Sit to Stand - 1 x daily - 7 x weekly - 2 sets - 10 reps Standing March with Counter Support - 1 x daily - 7 x weekly - 2 sets - 10 reps Romberg Stance - 1 x daily - 7 x weekly - 2 sets - 30 secs hold Supine Lower Trunk Rotation - 2 x daily - 7 x weekly - 3 x 30s to each side hold Supine Piriformis Stretch with Foot on Ground - 2 x daily - 7 x weekly - 3 x 30s with each leg hold Supine Single Knee to Chest Stretch - 2 x daily - 7 x weekly - 3 x 30s with each leg hold Supine Hip External Rotation Stretch - 2 x daily - 7 x weekly - 3 x 30s with each leg hold Standing Lumbar Extension at Wall - Forearms - 2 x daily - 7 x weekly - 2 sets - 10 reps - 3s hold Correct Seated Posture - 7 x weekly

## 2019-12-21 ENCOUNTER — Ambulatory Visit: Payer: Medicare Other | Attending: Internal Medicine

## 2019-12-21 ENCOUNTER — Other Ambulatory Visit: Payer: Self-pay

## 2019-12-21 DIAGNOSIS — M545 Low back pain, unspecified: Secondary | ICD-10-CM | POA: Insufficient documentation

## 2019-12-21 DIAGNOSIS — G8929 Other chronic pain: Secondary | ICD-10-CM | POA: Diagnosis present

## 2019-12-21 DIAGNOSIS — R2681 Unsteadiness on feet: Secondary | ICD-10-CM | POA: Diagnosis present

## 2019-12-21 NOTE — Therapy (Addendum)
[place] MAIN [facility] SERVICES [address] [place], [place], [address] Phone: [phone]   Fax:  [phone]  Physical Therapy Treatment/Recertification  Patient Details  Name: Timothy Farley MRN: [id number] Date of Birth: 1975/05/16 Referring Provider (PT): Dr. Bethann Punches   Encounter Date: 12/21/2019   PT End of Session - 12/21/19 1310    Visit Number 14    Number of Visits 33    Date for PT Re-Evaluation 02/15/20    Authorization Type Eval: 10/27/19    PT Start Time 1301    PT Stop Time 1345    PT Time Calculation (min) 44 min    Equipment Utilized During Treatment Gait belt    Activity Tolerance Patient tolerated treatment well    Behavior During Therapy Impulsive           Past Medical History:  Diagnosis Date  . Arthritis   . GERD (gastroesophageal reflux disease)   . Left foot drop   . Pleurisy   . Short-term memory loss   . Subdural hematoma ([place])   . TBI (traumatic brain injury) ([place]) 1994    Past Surgical History:  Procedure Laterality Date  . BRAIN SURGERY    . brain surgery x3    . ESOPHAGOGASTRODUODENOSCOPY (EGD) WITH PROPOFOL N/A 02/19/2018   Procedure: ESOPHAGOGASTRODUODENOSCOPY (EGD) WITH PROPOFOL;  Surgeon: Toledo, Benay Pike, MD;  Location: [facility] ENDOSCOPY;  Service: Gastroenterology;  Laterality: N/A;  . GASTROSTOMY W/ FEEDING TUBE    . trachostemy      There were no vitals filed for this visit.   Subjective Assessment - 12/21/19 1307    Subjective Patient reports that he had some relief of his back pain again after his last therapy session for about an hour. He rates his pain as a 4-5/10 upon arrival today. No new falls since last therapy session. He would like to continue working on his back pain. No specific questions or concerns at this time.    Pertinent History Patient is a 44 year old male with history of falling/unbalance. He notes that the room is always spinning and he has to hold onto his mom's  shirt/arm when walking. He has had 3 falls in the last few months. One of which was taking a step backwards, another forward, and the third his L foot got stuck and he fell forward. He notes that his L knee buckles. He had a TBI when he was younger, and is L side was affected most. He has back pain that goes down his L leg. He used to get Cortizone shots before his insurance stopped paying for them. His goal is to walk down the street confidently without holding on to anything. He notes that he does not want to walk with a cane or walker. 11/26/19: Pt complains of low back pain that has been going on for multiple years, since his mid/late 15s. Initially pain progressively worsened but over the last couple years has stayed relatively consistent. No history of injury, trauma, or surgery. He was seen by Dr. Sharlet Salina with physiatry who was going to do spinal injections but due to patient's anxiety regarding being able to lay safely on his narrow exam table pt was instead referred him to pain management. He has had plain film radiographs of his lumbar spine from [facility] dated 12/24/2017 which showed mild degenerative findings from L3-S1 most significantly at L5-S1 and facet degenerative changes at L5-S1. No acute findings. Pt underwent 2 rounds of spinal injections  with pain management which were helpful for his pain but his insurance wouldn't authorize any additional injections. At the time pt was unwilling to try physical therapy for his back pain however he is interested in pursuing conservative therapy management at this time. He has a remote history of a traumatic brain injury which caused ataxia and weakness to the left side of his body including L foot drop. He is currently being treated at this clinic for his imbalance and would like to include treatment for his low back pain.    Limitations House hold activities;Walking    Patient Stated Goals Walk down the street confidently without holding on to  anything    Currently in Pain? Yes    Pain Score 5     Pain Location Back    Pain Orientation Right;Left    Pain Descriptors / Indicators Aching;Throbbing    Pain Type Chronic pain    Pain Onset More than a month ago    Pain Frequency Intermittent               TREATMENT   Manual Therapy L1-L5CPA, grade II-III, 20s/bout x 2boutson each level,pt withlesstenderness and guarding todaycompared to last session; L1-L5L UPAgrade I-II, 20s/bout x 2boutson each level; Supine single knee to chest, FABER, FADIR, distal hamstring, proximal hamstring, and thoracolumbar rotationstretches x 30s hold each bilateral; Hooklying lumbar rotation rocking x 10 each direction; Hooklying lumbar posterior pelvic tilts 3s hold x 5, attempted anterior tilts however pt is unable to perform correctly; Updated HEP provided to patient, added thoracolumbar stretch and standing lumbar extension against wall;   E-stim NMES applied to posteriorlumbar spinewithContinuum unit usinglargemuscle spasm setting and large padsat pt tolerable intensityto reduce muscle tone(level 34)x 36mnutes.Pt in prone positioning to improve lumbar extension.Moist heat pack applied tolumbar spine during estim.    Trigger Point Dry Needling (TDN), unbilled Education performed with patient regarding potential benefit of TDN. Previously reviewed precautions and risks with patient and mother. Pt provided verbal consent to treatment. TDN performed to L lumbar multifidi with 3, 0.35 x 75 single needle placements at L3, L4, and L5 with deep ache reported by patient. Pistoning technique utilized. Pt denies any increase in pain following dry needling.    Patient motivated and participated well within session. He is pleased by the improvement of his pain following his therapy sessions however discouraged that his pain returns relatively quickly afterwards.  Patient reports only mild decrease in his pain today  following therapy session but does not rate on NPRS.Pt again encouraged to note his response to the dry needling to update therapist at next visit. Outcome measures not updated today as they were just performed on 12/03/19. At that time self-reported outcome measures such as FOTO and ABC both worsened. Patient denied any improvement in his balance however both mom and therapist have noted improvement in patient's confidence as well as lower extremity strength and gait. Patient's self-selected 153mait speed improved but his BERG remained unchanged. Patient has requested that his sessions focus on his low back pain and that has been the focus of his sessions over the last few weeks. Therapist will continue to work towards more active interventions to manage low back as pain continues to improve.Hewould benefit from additional skilled PT intervention to improve strength, balance and mobility as well as back pain.Ptwill benefit from PT services to address deficits in balance, strength, and back pain in order to return to full function at homewith less pain.  PT Education - 12/21/19 1708    Education Details HEP    Person(s) Educated Patient    Methods Explanation;Handout    Comprehension Verbalized understanding            PT Short Term Goals - 12/21/19 1721      PT SHORT TERM GOAL #1   Title Pt will be independent with HEP in order to improve strength and balance in order to decrease fall risk and improve function at home and work.    Baseline 10/27/19: HEP given    Time 4    Period Weeks    Status On-going    Target Date 01/18/20      PT SHORT TERM GOAL #2   Title Patient will be able to perform sit to stands without UE support in order to demonstrate increased functional strength of LEs.    Baseline 10/27/19: Patient unable to perform sit to stands without UE support or higher chair; 12/01/19: Progressing ability to perform sit to stands from  lower heights;    Time 4    Period Weeks    Status Partially Met    Target Date 01/18/20             PT Long Term Goals - 12/21/19 1722      PT LONG TERM GOAL #1   Title Pt will improve BERG by at least 3 points in order to demonstrate clinically significant improvement in balance.    Baseline 10/27/19: 41/56; 12/03/19: 40/56    Time 8    Period Weeks    Status On-going    Target Date 02/15/20      PT LONG TERM GOAL #2   Title Pt will perform 5TSTS in less than 12 seconds in order to decrease risk for falls.    Baseline 10/27/19: Unable to perform due to UE support; 12/01/19: Still requires elevated surface but height is lowering;    Time 8    Period Weeks    Status Partially Met    Target Date 02/15/20      PT LONG TERM GOAL #3   Title Pt will improve ABC by at least 13% in order to demonstrate clinically significant improvement in balance confidence.    Baseline 10/27/19: 51.25%; 12/03/19: 31.25%;    Time 8    Period Weeks    Status On-going    Target Date 02/15/20      PT LONG TERM GOAL #4   Title Patient will increase FOTO score to equal to or greater than 55 to demonstrate statistically significant improvement in mobility and quality of life    Baseline 10/27/19: 50; 12/03/19: 36    Time 8    Period Weeks    Status On-going    Target Date 02/15/20      PT LONG TERM GOAL #5   Title Patient will improve 10MWT self-selected pace to 1.74ms in order to be considered a full community ambulator.    Baseline 10/27/19: self-selected: 0.85 m/s, fastest: 1.56m; 12/03/19: self-selected: 10.2s = 0.9844m fastest: 6.7s = 1.43m3m  Time 8    Period Weeks    Status Partially Met    Target Date 02/15/20      Additional Long Term Goals   Additional Long Term Goals Yes      PT LONG TERM GOAL #6   Title Pt will decrease mODI scoreby at least 13 points in order demonstrate clinically significant reduction in pain/disability    Baseline 12/03/19: 42%  Time 8    Period Weeks     Status On-going    Target Date 02/15/20                 Plan - 12/21/19 1310    Clinical Impression Statement Patient motivated and participated well within session.  He is pleased by the improvement of his pain following his therapy sessions however discouraged that his pain returns relatively quickly afterwards.  Patient reports only mild decrease in his pain today following therapy session but does not rate on NPRS. Pt again encouraged to note his response to the dry needling to update therapist at next visit. Outcome measures not updated today as they were just performed on 12/03/19. At that time self-reported outcome measures such as FOTO and ABC both worsened. Patient denied any improvement in his balance however both mom and therapist have noted improvement in patient's confidence as well as lower extremity strength and gait.  Patient's self-selected 97mgait speed improved but his BERG remained unchanged. Patient has requested that his sessions focus on his low back pain and that has been the focus of his sessions over the last few weeks. Therapist will continue to work towards more active interventions to manage low back as pain continues to improve. He would benefit from additional skilled PT intervention to improve strength, balance and mobility as well as back pain. Pt will benefit from PT services to address deficits in balance, strength, and back pain in order to return to full function at home with less pain.    Personal Factors and Comorbidities Comorbidity 3+;Time since onset of injury/illness/exacerbation;Past/Current Experience    Comorbidities TBI, GERD, cognitive deficits, back pain    Examination-Activity Limitations Locomotion Level;Reach Overhead;Bathing;Stairs;Caring for Others    Examination-Participation Restrictions Driving;Community Activity;Meal Prep;Occupation;Volunteer;Yard Work    SMerchant navy officerEvolving/Moderate complexity    Rehab Potential  Fair    PT Frequency 2x / week    PT Duration 8 weeks    PT Treatment/Interventions ADLs/Self Care Home Management;Aquatic Therapy;Canalith Repostioning;Gait training;Stair training;Functional mobility training;Therapeutic activities;Balance training;Neuromuscular re-education;Therapeutic exercise;Patient/family education;Dry needling;Vestibular;Spinal Manipulations;Joint Manipulations;Electrical Stimulation;Cryotherapy;Passive range of motion;Traction;Moist Heat;Ultrasound;Cognitive remediation;Manual techniques    PT Next Visit Plan continue with low back interventions (gentle stretches, STM, postural correction, strengthening), Continue with strengthening and balance exercises, progress HEP,    PT Home Exercise Plan Medbridge Code: [email or web address]    Consulted and Agree with Plan of Care Patient           Patient will benefit from skilled therapeutic intervention in order to improve the following deficits and impairments:  Abnormal gait, Decreased strength, Dizziness, Difficulty walking, Decreased balance, Decreased coordination, Pain  Visit Diagnosis: Chronic midline low back pain, unspecified whether sciatica present - Plan: PT plan of care cert/re-cert  Unsteadiness on feet - Plan: PT plan of care cert/re-cert     Problem List Patient Active Problem List   Diagnosis Date Noted  . Lumbar facet arthropathy 02/24/2018  . Chronic pain syndrome 02/24/2018  . 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 Cognitive deficit as late effect of traumatic brain injury ([place] 08/15/2016  . History of traumatic brain injury 06/10/2014  . Osteoarthritis of right knee 06/10/2014   Timothy Farley, DPT, GCS  Timothy Farley 12/21/2019, 5:25 PM  [place] [facility] [address] [address]Phone: [phone]  Fax:  [phone] Name: DSabastien TylerMRN: [id number] of Birth: 1Mar 26, 1977

## 2019-12-21 NOTE — Addendum Note (Signed)
Addended by: Roxana Hires D on: 12/21/2019 05:25 PM   Modules accepted: Orders

## 2019-12-23 ENCOUNTER — Ambulatory Visit: Payer: Medicare Other

## 2019-12-23 ENCOUNTER — Other Ambulatory Visit: Payer: Self-pay

## 2019-12-23 DIAGNOSIS — M545 Low back pain, unspecified: Secondary | ICD-10-CM

## 2019-12-23 DIAGNOSIS — G8929 Other chronic pain: Secondary | ICD-10-CM

## 2019-12-23 DIAGNOSIS — R2681 Unsteadiness on feet: Secondary | ICD-10-CM

## 2019-12-23 NOTE — Therapy (Signed)
[place] MAIN [facility] SERVICES [address] [place], [place], [address] Phone: [phone]   Fax:  [phone]  Physical Therapy Treatment  Patient Details  Name: Timothy Farley MRN: [id number] Date of Birth: [birth year] Referring Provider (PT): Dr. Bethann Punches   Encounter Date: 12/23/2019   PT End of Session - 12/23/19 1446    Visit Number 15    Number of Visits 33    Date for PT Re-Evaluation 02/15/20    Authorization Type Eval: 10/27/19    PT Start Time 1430    PT Stop Time 1515    PT Time Calculation (min) 45 min    Equipment Utilized During Treatment Gait belt    Activity Tolerance Patient tolerated treatment well    Behavior During Therapy Impulsive           Past Medical History:  Diagnosis Date  . Arthritis   . GERD (gastroesophageal reflux disease)   . Left foot drop   . Pleurisy   . Short-term memory loss   . Subdural hematoma ([place])   . TBI (traumatic brain injury) ([place]) 1994    Past Surgical History:  Procedure Laterality Date  . BRAIN SURGERY    . brain surgery x3    . ESOPHAGOGASTRODUODENOSCOPY (EGD) WITH PROPOFOL N/A 02/19/2018   Procedure: ESOPHAGOGASTRODUODENOSCOPY (EGD) WITH PROPOFOL;  Surgeon: Toledo, Benay Pike, MD;  Location: [facility] ENDOSCOPY;  Service: Gastroenterology;  Laterality: N/A;  . GASTROSTOMY W/ FEEDING TUBE    . trachostemy      There were no vitals filed for this visit.   Subjective Assessment - 12/23/19 1438    Subjective Patient reports that he had some relief of his back pain yesterday. He rates his pain as a 4-5/10 upon arrival today. No new falls since last therapy session. He would like to continue working on his back pain and is hopeful that he can discharge toward the end of October. No specific questions or concerns at this time.    Pertinent History Patient is a 44 year old male with history of falling/unbalance. He notes that the room is always spinning and he has to hold onto his mom's  shirt/arm when walking. He has had 3 falls in the last few months. One of which was taking a step backwards, another forward, and the third his L foot got stuck and he fell forward. He notes that his L knee buckles. He had a TBI when he was younger, and is L side was affected most. He has back pain that goes down his L leg. He used to get Cortizone shots before his insurance stopped paying for them. His goal is to walk down the street confidently without holding on to anything. He notes that he does not want to walk with a cane or walker. 11/26/19: Pt complains of low back pain that has been going on for multiple years, since his mid/late 30s. Initially pain progressively worsened but over the last couple years has stayed relatively consistent. No history of injury, trauma, or surgery. He was seen by Dr. Sharlet Salina with physiatry who was going to do spinal injections but due to patient's anxiety regarding being able to lay safely on his narrow exam table pt was instead referred him to pain management. He has had plain film radiographs of his lumbar spine from [facility] clinic dated 12/24/2017 which showed mild degenerative findings from L3-S1 most significantly at L5-S1 and facet degenerative changes at L5-S1. No acute findings. Pt underwent 2 rounds  of spinal injections with pain management which were helpful for his pain but his insurance wouldn't authorize any additional injections. At the time pt was unwilling to try physical therapy for his back pain however he is interested in pursuing conservative therapy management at this time. He has a remote history of a traumatic brain injury which caused ataxia and weakness to the left side of his body including L foot drop. He is currently being treated at this clinic for his imbalance and would like to include treatment for his low back pain.    Limitations House hold activities;Walking    Patient Stated Goals Walk down the street confidently without holding on to  anything    Currently in Pain? Yes    Pain Score 5     Pain Location Back    Pain Orientation Right;Left;Lower    Pain Descriptors / Indicators Aching;Throbbing    Pain Type Chronic pain    Pain Onset More than a month ago    Pain Frequency Intermittent                TREATMENT   Manual Therapy L1-L5CPA, grade II-III, 20s/bout x 2boutson each level,pt withlesstenderness and guarding todaycompared to last session; L1-L5L UPAgrade I-II, 20s/bout x 2boutson each level; Supine single knee to chest, FABER, FADIR, distal hamstring, proximal hamstring, and thoracolumbar rotationstretches x 30s hold each bilateral; Standing repeated lumbar extension with forearms on wall x 5, pt provided extensive education about how to perform safely/correctly at home;   Ther-ex  Hooklying lumbar rotation rocking x 10 each direction; Hooklying lumbar posterior pelvic tilts 3s hold x 10, attempted anterior tilts however pt is still unable to perform correctly; Hooklying marches with cues for coordinated breathing x 10 BLE; Prone hip extension with considerable assistance required by therapist x10 BLE;   Trigger Point Dry Needling (TDN), unbilled Education performed with patient regarding potential benefit of TDN. Previously reviewed precautions and risks with patient and mother. Pt provided verbal consent to treatment. TDN performed to L lumbar multifidi with 4, 0.35 x 75 single needle placements from L3-L5 with deep ache and pinching reported by patient. Additional placement performed at L5 on R side as well with 1, 0.35 x 75 single needle placement. Pistoning technique utilized with decrease in muscle resistance noted during pistoning. Pt denies any increase in pain following dry needling.     Patient motivated and participated well within session. He is pleased by the improvement of his pain following his therapy sessions and had longer relief after his last therapy session  lasting well into Tuesday.  Patient and his mother feel like his balance is unlikely to improve and would like to continue therapy focus on his low back pain.  They are hopeful that he will be able to discharge by the end of October with improvement in his pain.  Progressed strengthening with patient today to work towards more active measures to maintain resolution of low back pain.  He does lack considerable strength with hip extension in prone bilaterally.  Repeated trigger point dry needling today on lumbar spine with one additional placement on the left side and incorporated one placement on the right side as well. Hewould benefit from additional skilled PT intervention to improve back pain.                                  PT Short Term Goals - 12/21/19 1721  PT SHORT TERM GOAL #1   Title Pt will be independent with HEP in order to improve strength and balance in order to decrease fall risk and improve function at home and work.    Baseline 10/27/19: HEP given    Time 4    Period Weeks    Status On-going    Target Date 01/18/20      PT SHORT TERM GOAL #2   Title Patient will be able to perform sit to stands without UE support in order to demonstrate increased functional strength of LEs.    Baseline 10/27/19: Patient unable to perform sit to stands without UE support or higher chair; 12/01/19: Progressing ability to perform sit to stands from lower heights;    Time 4    Period Weeks    Status Partially Met    Target Date 01/18/20             PT Long Term Goals - 12/21/19 1722      PT LONG TERM GOAL #1   Title Pt will improve BERG by at least 3 points in order to demonstrate clinically significant improvement in balance.    Baseline 10/27/19: 41/56; 12/03/19: 40/56    Time 8    Period Weeks    Status On-going    Target Date 02/15/20      PT LONG TERM GOAL #2   Title Pt will perform 5TSTS in less than 12 seconds in order to decrease risk for  falls.    Baseline 10/27/19: Unable to perform due to UE support; 12/01/19: Still requires elevated surface but height is lowering;    Time 8    Period Weeks    Status Partially Met    Target Date 02/15/20      PT LONG TERM GOAL #3   Title Pt will improve ABC by at least 13% in order to demonstrate clinically significant improvement in balance confidence.    Baseline 10/27/19: 51.25%; 12/03/19: 31.25%;    Time 8    Period Weeks    Status On-going    Target Date 02/15/20      PT LONG TERM GOAL #4   Title Patient will increase FOTO score to equal to or greater than 55 to demonstrate statistically significant improvement in mobility and quality of life    Baseline 10/27/19: 50; 12/03/19: 36    Time 8    Period Weeks    Status On-going    Target Date 02/15/20      PT LONG TERM GOAL #5   Title Patient will improve self-selected pace to 1.52m/s in order to be considered a full community ambulator.    Baseline 10/27/19: self-selected: 0.85 m/s, fastest: 1.48m/s; 12/03/19: self-selected: 10.2s = 0.55m/s, fastest: 6.7s = 1.9m/s    Time 8    Period Weeks    Status Partially Met    Target Date 02/15/20      Additional Long Term Goals   Additional Long Term Goals Yes      PT LONG TERM GOAL #6   Title Pt will decrease mODI scoreby at least 13 points in order demonstrate clinically significant reduction in pain/disability    Baseline 12/03/19: 42%    Time 8    Period Weeks    Status On-going    Target Date 02/15/20                 Plan - 12/23/19 1447    Clinical Impression Statement Patient motivated and participated well within  session.  He is pleased by the improvement of his pain following his therapy sessions and had longer relief after his last therapy session lasting well into Tuesday.  Patient and his mother feel like his balance is unlikely to improve and would like to continue therapy focus on his low back pain.  They are hopeful that he will be able to discharge by the  end of October with improvement in his pain.  Progressed strengthening with patient today to work towards more active measures to maintain resolution of low back pain.  He does lack considerable strength with hip extension in prone bilaterally.  Repeated trigger point dry needling today on lumbar spine with one additional placement on the left side and incorporated one placement on the right side as well. He would benefit from additional skilled PT intervention to improve back pain.    Personal Factors and Comorbidities Comorbidity 3+;Time since onset of injury/illness/exacerbation;Past/Current Experience    Comorbidities TBI, GERD, cognitive deficits, back pain    Examination-Activity Limitations Locomotion Level;Reach Overhead;Bathing;Stairs;Caring for Others    Examination-Participation Restrictions Driving;Community Activity;Meal Prep;Occupation;Volunteer;Yard Work    [employer] Evolving/Moderate complexity    Rehab Potential Fair    PT Frequency 2x / week    PT Duration 8 weeks    PT Treatment/Interventions ADLs/Self Care Home Management;Aquatic Therapy;Canalith Repostioning;Gait training;Stair training;Functional mobility training;Therapeutic activities;Balance training;Neuromuscular re-education;Therapeutic exercise;Patient/family education;Dry needling;Vestibular;Spinal Manipulations;Joint Manipulations;Electrical Stimulation;Cryotherapy;Passive range of motion;Traction;Moist Heat;Ultrasound;Cognitive remediation;Manual techniques    PT Next Visit Plan continue with low back interventions (gentle stretches, STM, postural correction, strengthening), Continue with strengthening and balance exercises, progress HEP,    PT Home Exercise Plan Medbridge Code: [id number]    Consulted and Agree with Plan of Care Patient           Patient will benefit from skilled therapeutic intervention in order to improve the following deficits and impairments:  Abnormal gait, Decreased  strength, Dizziness, Difficulty walking, Decreased balance, Decreased coordination, Pain  Visit Diagnosis: Chronic midline low back pain, unspecified whether sciatica present  Unsteadiness on feet     Problem List Patient Active Problem List   Diagnosis Date Noted  . Lumbar facet arthropathy 02/24/2018  . Chronic pain syndrome 02/24/2018  . 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 History of traumatic brain injury 06/10/2014  . Osteoarthritis of right knee 06/10/2014   Phillips Grout PT, DPT, GCS  Codi Kertz 12/23/2019, 3:46 PM  [place] MAIN [facility] SERVICES [address] [place], [place], [address] Phone: [phone]   Fax:  [phone]  Name: Charleton Deyoung MRN: [id number] Date of Birth: [birth year]

## 2019-12-29 ENCOUNTER — Other Ambulatory Visit: Payer: Self-pay

## 2019-12-29 ENCOUNTER — Ambulatory Visit: Payer: Medicare Other

## 2019-12-29 DIAGNOSIS — R2681 Unsteadiness on feet: Secondary | ICD-10-CM

## 2019-12-29 DIAGNOSIS — M545 Low back pain, unspecified: Secondary | ICD-10-CM

## 2019-12-29 DIAGNOSIS — G8929 Other chronic pain: Secondary | ICD-10-CM

## 2019-12-29 NOTE — Therapy (Signed)
[facility] MAIN [facility] SERVICES [address] [place], [place], [address] Phone: [phone]   Fax:  [phone]  Physical Therapy Treatment  Patient Details  Name: Timothy Farley MRN: [id number] Date of Birth: Jul 14, 1975 Referring Provider (PT): Dr. Cherie Ouch   Encounter Date: 12/29/2019   PT End of Session - 12/29/19 1443    Visit Number 16    Number of Visits 33    Date for PT Re-Evaluation 02/15/20    Authorization Type Eval: 10/27/19    PT Start Time 1445    PT Stop Time 1530    PT Time Calculation (min) 45 min    Equipment Utilized During Treatment Gait belt    Activity Tolerance Patient tolerated treatment well    Behavior During Therapy Impulsive           Past Medical History:  Diagnosis Date  . Arthritis   . GERD (gastroesophageal reflux disease)   . Left foot drop   . Pleurisy   . Short-term memory loss   . Subdural hematoma (HCC)   . TBI (traumatic brain injury) (HCC) 1994    Past Surgical History:  Procedure Laterality Date  . BRAIN SURGERY    . brain surgery x3    . ESOPHAGOGASTRODUODENOSCOPY (EGD) WITH PROPOFOL N/A 02/19/2018   Procedure: ESOPHAGOGASTRODUODENOSCOPY (EGD) WITH PROPOFOL;  Surgeon: Toledo, Boykin Nearing, MD;  Location: [facility] ENDOSCOPY;  Service: Gastroenterology;  Laterality: N/A;  . GASTROSTOMY W/ FEEDING TUBE    . trachostemy      There were no vitals filed for this visit.   Subjective Assessment - 12/29/19 1442    Subjective Patient reports that he does not recall how much improvement he had after his last session or how long it lasted.  Overall however he reports that his pain continues to improve and it remains consistently improved day after day. He rates his pain as a 4-5/10 upon arrival today. No new falls since last therapy session.  He has become better about doing his HEP.  He did utilize lumbar support in his recliner for a few days and does not recall whether it was helpful or not.  He has  forgotten to get up and move around every 30 to 45 minutes while gaming at home as advised. No specific questions or concerns at this time.    Pertinent History Patient is a 44 year old male with history of falling/unbalance. He notes that the room is always spinning and he has to hold onto his mom's shirt/arm when walking. He has had 3 falls in the last few months. One of which was taking a step backwards, another forward, and the third his L foot got stuck and he fell forward. He notes that his L knee buckles. He had a TBI when he was younger, and is L side was affected most. He has back pain that goes down his L leg. He used to get Cortizone shots before his insurance stopped paying for them. His goal is to walk down the street confidently without holding on to anything. He notes that he does not want to walk with a cane or walker. 11/26/19: Pt complains of low back pain that has been going on for multiple years, since his mid/late 30s. Initially pain progressively worsened but over the last couple years has stayed relatively consistent. No history of injury, trauma, or surgery. He was seen by Dr. Yves Dill with physiatry who was going to do spinal injections but due to patient's  anxiety regarding being able to lay safely on his narrow exam table pt was instead referred him to pain management. He has had plain film radiographs of his lumbar spine from [facility] clinic dated 12/24/2017 which showed mild degenerative findings from L3-S1 most significantly at L5-S1 and facet degenerative changes at L5-S1. No acute findings. Pt underwent 2 rounds of spinal injections with pain management which were helpful for his pain but his insurance wouldn't authorize any additional injections. At the time pt was unwilling to try physical therapy for his back pain however he is interested in pursuing conservative therapy management at this time. He has a remote history of a traumatic brain injury which caused ataxia and weakness to  the left side of his body including L foot drop. He is currently being treated at this clinic for his imbalance and would like to include treatment for his low back pain.    Limitations House hold activities;Walking    Patient Stated Goals Walk down the street confidently without holding on to anything    Currently in Pain? Yes    Pain Score 5     Pain Location Back    Pain Orientation Right;Left;Lower    Pain Descriptors / Indicators Aching;Throbbing    Pain Type Chronic pain    Pain Onset More than a month ago    Pain Frequency Intermittent              TREATMENT   Manual Therapy L1-L5CPA, grade II-III, 20s/bout x 1bouton each level,pt withlesstenderness and guarding todaycompared to last session; L1-L5L UPAgrade I-II, 20s/bout x 1 boutson each level; Supine single knee to chest, FABER, FADIR, distal hamstring, proximal hamstring, and thoracolumbar rotationstretches x 30s hold each bilateral; Supine left hip long axis distraction with belt assist mobilizations 30 seconds x 2; Supine left hip inferior mobilizations, grade 3 with hip flexed to 90 degrees and belt assist, 30 seconds x 2; Supine left hip medial to lateral mobilizations, grade 3 with hip flexed to 45 degrees and belt assist, 30 seconds x 2 STM to bilateral lumbar paraspinals with long-duration trigger point release;   Ther-ex  NuStep L1 x 5 minutes for warm-up during history with moist heat back on lumbar spine (4 minutes unbilled); Hooklying lumbar rotation rocking x 10 each direction; Hooklying marches with cues for coordinated breathing x 10 BLE; Supine straight leg raise with cues for abdominal bracing x10 BLE;   Trigger Point Dry Needling (TDN), unbilled Education performed with patient regarding potential benefit of TDN. Previously reviewed precautions and risks with patient and mother. Pt provided verbal consent to treatment. TDN performed to R and L lumbar multifidi with 3, 0.35 x 75  single needle placements from L3-L5 on each side (6 total) with deep ache and pinching reported by patient. Pistoning technique utilized with decrease in muscle resistance noted during pistoning on the right side. Pt denies any increase in pain following dry needling.     Patient motivated and participated well within session. He is pleased by the improvement of his pain following his therapy sessions and reports consistent reduction in his pain day after day. Progressed strengthening with patient today to work towards more active measures to maintain resolution of low back pain.  Included additional left hip mobilizations for lumbar distraction and to assess response due to complaints of left lower extremity pain from back.  Repeated trigger point dry needling today on lumbar spine with additional placements on the right side. Hewould benefit from additional skilled PT intervention  to improve back pain.                           PT Short Term Goals - 12/21/19 1721      PT SHORT TERM GOAL #1   Title Pt will be independent with HEP in order to improve strength and balance in order to decrease fall risk and improve function at home and work.    Baseline 10/27/19: HEP given    Time 4    Period Weeks    Status On-going    Target Date 01/18/20      PT SHORT TERM GOAL #2   Title Patient will be able to perform sit to stands without UE support in order to demonstrate increased functional strength of LEs.    Baseline 10/27/19: Patient unable to perform sit to stands without UE support or higher chair; 12/01/19: Progressing ability to perform sit to stands from lower heights;    Time 4    Period Weeks    Status Partially Met    Target Date 01/18/20             PT Long Term Goals - 12/21/19 1722      PT LONG TERM GOAL #1   Title Pt will improve BERG by at least 3 points in order to demonstrate clinically significant improvement in balance.    Baseline 10/27/19: 41/56;  12/03/19: 40/56    Time 8    Period Weeks    Status On-going    Target Date 02/15/20      PT LONG TERM GOAL #2   Title Pt will perform 5TSTS in less than 12 seconds in order to decrease risk for falls.    Baseline 10/27/19: Unable to perform due to UE support; 12/01/19: Still requires elevated surface but height is lowering;    Time 8    Period Weeks    Status Partially Met    Target Date 02/15/20      PT LONG TERM GOAL #3   Title Pt will improve ABC by at least 13% in order to demonstrate clinically significant improvement in balance confidence.    Baseline 10/27/19: 51.25%; 12/03/19: 31.25%;    Time 8    Period Weeks    Status On-going    Target Date 02/15/20      PT LONG TERM GOAL #4   Title Patient will increase FOTO score to equal to or greater than 55 to demonstrate statistically significant improvement in mobility and quality of life    Baseline 10/27/19: 50; 12/03/19: 36    Time 8    Period Weeks    Status On-going    Target Date 02/15/20      PT LONG TERM GOAL #5   Title Patient will improve 10MWT self-selected pace to 1.5m/s in order to be considered a full community ambulator.    Baseline 10/27/19: self-selected: 0.85 m/s, fastest: 1.13m/s; 12/03/19: self-selected: 10.2s = 0.39m/s, fastest: 6.7s = 1.12m/s    Time 8    Period Weeks    Status Partially Met    Target Date 02/15/20      Additional Long Term Goals   Additional Long Term Goals Yes      PT LONG TERM GOAL #6   Title Pt will decrease mODI scoreby at least 13 points in order demonstrate clinically significant reduction in pain/disability    Baseline 12/03/19: 42%    Time 8    Period Weeks  Status On-going    Target Date 02/15/20                 Plan - 12/29/19 1443    Clinical Impression Statement Patient motivated and participated well within session.  He is pleased by the improvement of his pain following his therapy sessions and reports consistent reduction in his pain day after day. Progressed  strengthening with patient today to work towards more active measures to maintain resolution of low back pain.  Included additional left hip mobilizations for lumbar distraction and to assess response due to complaints of left lower extremity pain from back.  Repeated trigger point dry needling today on lumbar spine with additional placements on the right side. He would benefit from additional skilled PT intervention to improve back pain.    Personal Factors and Comorbidities Comorbidity 3+;Time since onset of injury/illness/exacerbation;Past/Current Experience    Comorbidities TBI, GERD, cognitive deficits, back pain    Examination-Activity Limitations Locomotion Level;Reach Overhead;Bathing;Stairs;Caring for Others    Examination-Participation Restrictions Driving;Community Activity;Meal Prep;Occupation;Volunteer;Yard Work    [employer] officer Evolving/Moderate complexity    Rehab Potential Fair    PT Frequency 2x / week    PT Duration 8 weeks    PT Treatment/Interventions ADLs/Self Care Home Management;Aquatic Therapy;Canalith Repostioning;Gait training;Stair training;Functional mobility training;Therapeutic activities;Balance training;Neuromuscular re-education;Therapeutic exercise;Patient/family education;Dry needling;Vestibular;Spinal Manipulations;Joint Manipulations;Electrical Stimulation;Cryotherapy;Passive range of motion;Traction;Moist Heat;Ultrasound;Cognitive remediation;Manual techniques    PT Next Visit Plan continue with low back interventions (gentle stretches, STM, postural correction, strengthening), Continue with strengthening and balance exercises, progress HEP,    PT Home Exercise Plan Medbridge Code: [email or web address]    Consulted and Agree with Plan of Care Patient           Patient will benefit from skilled therapeutic intervention in order to improve the following deficits and impairments:  Abnormal gait, Decreased strength, Dizziness, Difficulty walking, Decreased  balance, Decreased coordination, Pain  Visit Diagnosis: Chronic midline low back pain, unspecified whether sciatica present  Unsteadiness on feet     Problem List Patient Active Problem List   Diagnosis Date Noted  . Lumbar facet arthropathy 02/24/2018  . Chronic pain syndrome 02/24/2018  . 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 History of traumatic brain injury 06/10/2014  . Osteoarthritis of right knee 06/10/2014   Phillips Grout PT, DPT, GCS  Raman Featherston 12/29/2019, 3:56 PM  [place] MAIN [facility] SERVICES [address] [place], [place], [address] Phone: [phone]   Fax:  [phone]  Name: Dinesh Ulysse MRN: [id number] Date of Birth: December 14, 1975

## 2019-12-31 ENCOUNTER — Other Ambulatory Visit: Payer: Self-pay

## 2019-12-31 ENCOUNTER — Ambulatory Visit: Payer: Medicare Other

## 2019-12-31 DIAGNOSIS — R2681 Unsteadiness on feet: Secondary | ICD-10-CM

## 2019-12-31 DIAGNOSIS — M545 Low back pain, unspecified: Secondary | ICD-10-CM | POA: Diagnosis not present

## 2019-12-31 DIAGNOSIS — G8929 Other chronic pain: Secondary | ICD-10-CM

## 2019-12-31 NOTE — Therapy (Signed)
[place] MAIN [facility] SERVICES [address] [place], [place], [address] Phone: [phone]   Fax:  [phone]  Physical Therapy Treatment  Patient Details  Name: Timothy Farley MRN: [id number] Date of Birth: 04/26/1975 Referring Provider (PT): Dr. Bethann Punches   Encounter Date: 12/31/2019   PT End of Session - 12/31/19 1555    Visit Number 17    Number of Visits 33    Date for PT Re-Evaluation 02/15/20    Authorization Type Eval: 10/27/19    PT Start Time 1518    PT Stop Time 1600    PT Time Calculation (min) 42 min    Equipment Utilized During Treatment Gait belt    Activity Tolerance Patient tolerated treatment well    Behavior During Therapy Impulsive           Past Medical History:  Diagnosis Date  . Arthritis   . GERD (gastroesophageal reflux disease)   . Left foot drop   . Pleurisy   . Short-term memory loss   . Subdural hematoma ([place])   . TBI (traumatic brain injury) ([place]) 1994    Past Surgical History:  Procedure Laterality Date  . BRAIN SURGERY    . brain surgery x3    . ESOPHAGOGASTRODUODENOSCOPY (EGD) WITH PROPOFOL N/A 02/19/2018   Procedure: ESOPHAGOGASTRODUODENOSCOPY (EGD) WITH PROPOFOL;  Surgeon: Toledo, Benay Pike, MD;  Location: [facility] ENDOSCOPY;  Service: Gastroenterology;  Laterality: N/A;  . GASTROSTOMY W/ FEEDING TUBE    . trachostemy      There were no vitals filed for this visit.   Subjective Assessment - 12/31/19 1552    Subjective Patient reports that he is having slightly more back pain upon arrival today. He rates the low back pain as 6/10 upon arrival. No new falls since last therapy session.  He has forgotten to get up and move around every 30 to 45 minutes while gaming at home as advised. He does report overall less pain in back since last therapy session until today. No specific questions or concerns at this time.    Pertinent History Patient is a 44 year old male with history of falling/unbalance.  He notes that the room is always spinning and he has to hold onto his mom's shirt/arm when walking. He has had 3 falls in the last few months. One of which was taking a step backwards, another forward, and the third his L foot got stuck and he fell forward. He notes that his L knee buckles. He had a TBI when he was younger, and is L side was affected most. He has back pain that goes down his L leg. He used to get Cortizone shots before his insurance stopped paying for them. His goal is to walk down the street confidently without holding on to anything. He notes that he does not want to walk with a cane or walker. 11/26/19: Pt complains of low back pain that has been going on for multiple years, since his mid/late 68s. Initially pain progressively worsened but over the last couple years has stayed relatively consistent. No history of injury, trauma, or surgery. He was seen by Dr. Sharlet Salina with physiatry who was going to do spinal injections but due to patient's anxiety regarding being able to lay safely on his narrow exam table pt was instead referred him to pain management. He has had plain film radiographs of his lumbar spine from [facility] clinic dated 12/24/2017 which showed mild degenerative findings from L3-S1 most significantly at  L5-S1 and facet degenerative changes at L5-S1. No acute findings. Pt underwent 2 rounds of spinal injections with pain management which were helpful for his pain but his insurance wouldn't authorize any additional injections. At the time pt was unwilling to try physical therapy for his back pain however he is interested in pursuing conservative therapy management at this time. He has a remote history of a traumatic brain injury which caused ataxia and weakness to the left side of his body including L foot drop. He is currently being treated at this clinic for his imbalance and would like to include treatment for his low back pain.    Limitations House hold activities;Walking     Patient Stated Goals Walk down the street confidently without holding on to anything    Currently in Pain? Yes    Pain Score 5     Pain Location Back    Pain Orientation Right;Left;Lower    Pain Descriptors / Indicators Aching;Throbbing    Pain Type Chronic pain    Pain Onset More than a month ago    Pain Frequency Intermittent              TREATMENT   Manual Therapy NuStep L1 x 10 minutes for warm-up during history with moist heat back on lumbar spine (8 minutes unbilled); Supine single knee to chest, FABER, FADIR, distal hamstring, proximal hamstring, and thoracolumbar rotationstretches x 30s hold each bilateral; L1-L5CPA, grade II-III, 20s/bout x 1bouton each level,pt withlesstenderness and guarding todaycompared to last session; L1-L5L UPAgrade I-II, 20s/bout x 1 boutson each level; STM to bilateral lumbar paraspinals with long-duration trigger point release;   E-stim NMES applied to posteriorlumbar spinewithContinuum unit usinglargemuscle spasm setting and large padsat pt tolerable intensityto reduce muscle tone(level 25)x 67minutes. Pt in prone positioning to improve lumbar extension. Moist heat pack applied tolumbar spine during estim.     Patient motivated and participated well within session.Avoided trigger point dry needling today to assess response to estim alone. He is pleased by the improvement of his pain following his therapy sessions and reports consistent reduction in his pain day after day. However he continues to have pain and it is more irritable this morning compared to the last few days. Utilized NMES for muscle spasm today in addition to manual techniques and at the end of the session pt reports 0.5/10 back pain. Pt encouraged to bring his home TENS unit to therapy so that it can be adjusted to find a setting that is helpful for patient. Hewould benefit from additional skilled PT intervention to improve back  pain.                            PT Short Term Goals - 12/21/19 1721      PT SHORT TERM GOAL #1   Title Pt will be independent with HEP in order to improve strength and balance in order to decrease fall risk and improve function at home and work.    Baseline 10/27/19: HEP given    Time 4    Period Weeks    Status On-going    Target Date 01/18/20      PT SHORT TERM GOAL #2   Title Patient will be able to perform sit to stands without UE support in order to demonstrate increased functional strength of LEs.    Baseline 10/27/19: Patient unable to perform sit to stands without UE support or higher chair; 12/01/19: Progressing ability to perform  sit to stands from lower heights;    Time 4    Period Weeks    Status Partially Met    Target Date 01/18/20             PT Long Term Goals - 12/21/19 1722      PT LONG TERM GOAL #1   Title Pt will improve BERG by at least 3 points in order to demonstrate clinically significant improvement in balance.    Baseline 10/27/19: 41/56; 12/03/19: 40/56    Time 8    Period Weeks    Status On-going    Target Date 02/15/20      PT LONG TERM GOAL #2   Title Pt will perform 5TSTS in less than 12 seconds in order to decrease risk for falls.    Baseline 10/27/19: Unable to perform due to UE support; 12/01/19: Still requires elevated surface but height is lowering;    Time 8    Period Weeks    Status Partially Met    Target Date 02/15/20      PT LONG TERM GOAL #3   Title Pt will improve ABC by at least 13% in order to demonstrate clinically significant improvement in balance confidence.    Baseline 10/27/19: 51.25%; 12/03/19: 31.25%;    Time 8    Period Weeks    Status On-going    Target Date 02/15/20      PT LONG TERM GOAL #4   Title Patient will increase FOTO score to equal to or greater than 55 to demonstrate statistically significant improvement in mobility and quality of life    Baseline 10/27/19: 50; 12/03/19: 36    Time  8    Period Weeks    Status On-going    Target Date 02/15/20      PT LONG TERM GOAL #5   Title Patient will improve 10MWT self-selected pace to 1.29m/s in order to be considered a full community ambulator.    Baseline 10/27/19: self-selected: 0.85 m/s, fastest: 1.34m/s; 12/03/19: self-selected: 10.2s = 0.38m/s, fastest: 6.7s = 1.29m/s    Time 8    Period Weeks    Status Partially Met    Target Date 02/15/20      Additional Long Term Goals   Additional Long Term Goals Yes      PT LONG TERM GOAL #6   Title Pt will decrease mODI scoreby at least 13 points in order demonstrate clinically significant reduction in pain/disability    Baseline 12/03/19: 42%    Time 8    Period Weeks    Status On-going    Target Date 02/15/20                 Plan - 12/31/19 1555    Clinical Impression Statement Patient motivated and participated well within session. Avoided trigger point dry needling today to assess response to estim alone. He is pleased by the improvement of his pain following his therapy sessions and reports consistent reduction in his pain day after day. However he continues to have pain and it is more irritable this morning compared to the last few days. Utilized NMES for muscle spasm today in addition to manual techniques and at the end of the session pt reports 0.5/10 back pain. Pt encouraged to bring his home TENS unit to therapy so that it can be adjusted to find a setting that is helpful for patient. He would benefit from additional skilled PT intervention to improve back pain.    Personal  Factors and Comorbidities Comorbidity 3+;Time since onset of injury/illness/exacerbation;Past/Current Experience    Comorbidities TBI, GERD, cognitive deficits, back pain    Examination-Activity Limitations Locomotion Level;Reach Overhead;Bathing;Stairs;Caring for Others    Examination-Participation Restrictions Driving;Community Activity;Meal Prep;Occupation;Volunteer;Yard Work     [employer]chant [employer] officer Evolving/Moderate complexity    Rehab Potential Fair    PT Frequency 2x / week    PT Duration 8 weeks    PT Treatment/Interventions ADLs/Self Care Home Management;Aquatic Therapy;Canalith Repostioning;Gait training;Stair training;Functional mobility training;Therapeutic activities;Balance training;Neuromuscular re-education;Therapeutic exercise;Patient/family education;Dry needling;Vestibular;Spinal Manipulations;Joint Manipulations;Electrical Stimulation;Cryotherapy;Passive range of motion;Traction;Moist Heat;Ultrasound;Cognitive remediation;Manual techniques    PT Next Visit Plan continue with low back interventions (gentle stretches, STM, postural correction, strengthening), Continue with strengthening and balance exercises, progress HEP,    PT Home Exercise Plan Medbridge Code: [email or web address]    Consulted and Agree with Plan of Care Patient           Patient will benefit from skilled therapeutic intervention in order to improve the following deficits and impairments:  Abnormal gait, Decreased strength, Dizziness, Difficulty walking, Decreased balance, Decreased coordination, Pain  Visit Diagnosis: Chronic midline low back pain, unspecified whether sciatica present  Unsteadiness on feet     Problem List Patient Active Problem List   Diagnosis Date Noted  . Lumbar facet arthropathy 02/24/2018  . Chronic pain syndrome 02/24/2018  . 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 History of traumatic brain injury 06/10/2014  . Osteoarthritis of right knee 06/10/2014   Phillips Grout PT, DPT, GCS  Farzana Koci 01/01/2020, 8:37 AM  [place] MAIN [facility] SERVICES [address] [place], [place], [address] Phone: [phone]   Fax:  [phone]  Name: Timothy Farley MRN: [id number] Date of Birth: [birth year]

## 2020-01-05 ENCOUNTER — Other Ambulatory Visit: Payer: Self-pay

## 2020-01-05 ENCOUNTER — Ambulatory Visit: Payer: Medicare Other

## 2020-01-05 DIAGNOSIS — G8929 Other chronic pain: Secondary | ICD-10-CM

## 2020-01-05 DIAGNOSIS — M545 Low back pain, unspecified: Secondary | ICD-10-CM | POA: Diagnosis not present

## 2020-01-05 NOTE — Therapy (Addendum)
[place] MAIN [facility] SERVICES [address] [place], [place], [address] Phone: [phone]   Fax:  [phone]  Physical Therapy Treatment  Patient Details  Name: Timothy Farley MRN: [id number] Date of Birth: 01-Sep-1975 Referring Provider (PT): Dr. Bethann Punches   Encounter Date: 01/05/2020   PT End of Session - 01/05/20 1427    Visit Number 18    Number of Visits 33    Date for PT Re-Evaluation 02/15/20    Authorization Type Eval: 10/27/19    PT Start Time 1430    PT Stop Time 1515    PT Time Calculation (min) 45 min    Equipment Utilized During Treatment Gait belt    Activity Tolerance Patient tolerated treatment well    Behavior During Therapy Impulsive           Past Medical History:  Diagnosis Date  . Arthritis   . GERD (gastroesophageal reflux disease)   . Left foot drop   . Pleurisy   . Short-term memory loss   . Subdural hematoma ([place])   . TBI (traumatic brain injury) ([place]) 1994    Past Surgical History:  Procedure Laterality Date  . BRAIN SURGERY    . brain surgery x3    . ESOPHAGOGASTRODUODENOSCOPY (EGD) WITH PROPOFOL N/A 02/19/2018   Procedure: ESOPHAGOGASTRODUODENOSCOPY (EGD) WITH PROPOFOL;  Surgeon: Toledo, Benay Pike, MD;  Location: [facility] ENDOSCOPY;  Service: Gastroenterology;  Laterality: N/A;  . GASTROSTOMY W/ FEEDING TUBE    . trachostemy      There were no vitals filed for this visit.   Subjective Assessment - 01/05/20 1426    Subjective Patient reports that he is doing well today. He rates his back pain as 3-4/10 upon arrival and states that his back pain consistently has been closer to 3-4/10 which is much improved compared to before starting therapy. He still forgets to get up and move around every 30 to 45 minutes while gaming at home as advised. He also has been forgetting to use lumbar support while sitting in his recliner. He did have a fall since the last therapy session. Pt reports that he was walking  toward his recliner and he stumbled when he as a few feet away. He went face forward toward the recliner and his face went into the backrest. He was able to turn himself around and get his bottom onto the seat. Pt denies any significant injury from the fall. No specific questions.    Pertinent History Patient is a 44 year old male with history of falling/unbalance. He notes that the room is always spinning and he has to hold onto his mom's shirt/arm when walking. He has had 3 falls in the last few months. One of which was taking a step backwards, another forward, and the third his L foot got stuck and he fell forward. He notes that his L knee buckles. He had a TBI when he was younger, and is L side was affected most. He has back pain that goes down his L leg. He used to get Cortizone shots before his insurance stopped paying for them. His goal is to walk down the street confidently without holding on to anything. He notes that he does not want to walk with a cane or walker. 11/26/19: Pt complains of low back pain that has been going on for multiple years, since his mid/late 1s. Initially pain progressively worsened but over the last couple years has stayed relatively consistent. No history of injury,  trauma, or surgery. He was seen by Dr. Sharlet Salina with physiatry who was going to do spinal injections but due to patient's anxiety regarding being able to lay safely on his narrow exam table pt was instead referred him to pain management. He has had plain film radiographs of his lumbar spine from [facility] clinic dated 12/24/2017 which showed mild degenerative findings from L3-S1 most significantly at L5-S1 and facet degenerative changes at L5-S1. No acute findings. Pt underwent 2 rounds of spinal injections with pain management which were helpful for his pain but his insurance wouldn't authorize any additional injections. At the time pt was unwilling to try physical therapy for his back pain however he is interested in  pursuing conservative therapy management at this time. He has a remote history of a traumatic brain injury which caused ataxia and weakness to the left side of his body including L foot drop. He is currently being treated at this clinic for his imbalance and would like to include treatment for his low back pain.    Limitations House hold activities;Walking    Patient Stated Goals Walk down the street confidently without holding on to anything    Currently in Pain? Yes    Pain Location Back    Pain Orientation Lower    Pain Descriptors / Indicators Aching;Throbbing    Pain Type Chronic pain    Pain Onset More than a month ago    Pain Frequency Constant               TREATMENT   Manual Therapy Supine single knee to chest, FABER, FADIR, distal hamstring, proximal hamstring, and thoracolumbar rotationstretches x 30s hold each bilateral; Supine left hip long axis distraction with belt assist mobilizations 30 seconds x 3; Supine left hip inferior mobilizations, grade 3 with L hip in FABER position and belt assist, 30 seconds x 3; Supine left hip medial to lateral mobilizations, grade 3 with hip flexed to 45 degrees and belt assist, 30 seconds x 3   Ther-ex  NuStep L1 x 5 minutes for warm-up during history with moist heat back on lumbar spine (3 minutes unbilled); Hooklying lumbar rotation rocking x 10 each direction; Hooklying marches with cues for coordinated breathing x 10 BLE; Supine straight leg raise with cues for abdominal bracing x10 BLE; Hooklying bridges with 3s hold and cues for glut contraction x 10; Supine straight leg manually resisted abduction x 10 BLE; Connected patient's home estim unit and explained settings to patient and potential benefit;    Pt educated throughout session about proper posture and technique with exercises. Improved exercise technique, movement at target joints, use of target muscles after min to mod verbal, visual, tactile cues.    Patient  demonstrated good motivation during session today.Avoided trigger point dry needling again today as pt had good carryover after last session without needling. He is pleased by the improvement of his pain following his therapy sessions and reports consistent reduction in his pain day after day. Reintroduced L hip mobilizations today and also progressed mat table strengthening. Connected patient's home TENS unit and explained how to use at home. He stats that doesn't feel like he will likely use it at home but would like to learn how to set it up regardless. Ptwould benefit from additional skilled PT intervention to improve back pain.                            PT Short Term  Goals - 12/21/19 1721      PT SHORT TERM GOAL #1   Title Pt will be independent with HEP in order to improve strength and balance in order to decrease fall risk and improve function at home and work.    Baseline 10/27/19: HEP given    Time 4    Period Weeks    Status On-going    Target Date 01/18/20      PT SHORT TERM GOAL #2   Title Patient will be able to perform sit to stands without UE support in order to demonstrate increased functional strength of LEs.    Baseline 10/27/19: Patient unable to perform sit to stands without UE support or higher chair; 12/01/19: Progressing ability to perform sit to stands from lower heights;    Time 4    Period Weeks    Status Partially Met    Target Date 01/18/20             PT Long Term Goals - 12/21/19 1722      PT LONG TERM GOAL #1   Title Pt will improve BERG by at least 3 points in order to demonstrate clinically significant improvement in balance.    Baseline 10/27/19: 41/56; 12/03/19: 40/56    Time 8    Period Weeks    Status On-going    Target Date 02/15/20      PT LONG TERM GOAL #2   Title Pt will perform 5TSTS in less than 12 seconds in order to decrease risk for falls.    Baseline 10/27/19: Unable to perform due to UE support; 12/01/19: Still  requires elevated surface but height is lowering;    Time 8    Period Weeks    Status Partially Met    Target Date 02/15/20      PT LONG TERM GOAL #3   Title Pt will improve ABC by at least 13% in order to demonstrate clinically significant improvement in balance confidence.    Baseline 10/27/19: 51.25%; 12/03/19: 31.25%;    Time 8    Period Weeks    Status On-going    Target Date 02/15/20      PT LONG TERM GOAL #4   Title Patient will increase FOTO score to equal to or greater than 55 to demonstrate statistically significant improvement in mobility and quality of life    Baseline 10/27/19: 50; 12/03/19: 36    Time 8    Period Weeks    Status On-going    Target Date 02/15/20      PT LONG TERM GOAL #5   Title Patient will improve 10MWT self-selected pace to 1.75m/s in order to be considered a full community ambulator.    Baseline 10/27/19: self-selected: 0.85 m/s, fastest: 1.74m/s; 12/03/19: self-selected: 10.2s = 0.90m/s, fastest: 6.7s = 1.81m/s    Time 8    Period Weeks    Status Partially Met    Target Date 02/15/20      Additional Long Term Goals   Additional Long Term Goals Yes      PT LONG TERM GOAL #6   Title Pt will decrease mODI scoreby at least 13 points in order demonstrate clinically significant reduction in pain/disability    Baseline 12/03/19: 42%    Time 8    Period Weeks    Status On-going    Target Date 02/15/20                 Plan - 01/05/20 1427  Clinical Impression Statement Patient demonstrated good motivation during session today. Avoided trigger point dry needling again today as pt had good carryover after last session without needling. He is pleased by the improvement of his pain following his therapy sessions and reports consistent reduction in his pain day after day. Reintroduced L hip mobilizations today and also progressed mat table strengthening. Connected patient's home TENS unit and explained how to use at home. He stats that doesn't feel  like he will likely use it at home but would like to learn how to set it up regardless. Pt would benefit from additional skilled PT intervention to improve back pain.    Personal Factors and Comorbidities Comorbidity 3+;Time since onset of injury/illness/exacerbation;Past/Current Experience    Comorbidities TBI, GERD, cognitive deficits, back pain    Examination-Activity Limitations Locomotion Level;Reach Overhead;Bathing;Stairs;Caring for Others    Examination-Participation Restrictions Driving;Community Activity;Meal Prep;Occupation;Volunteer;Yard Work    [employer]chant [employer] officer Evolving/Moderate complexity    Rehab Potential Fair    PT Frequency 2x / week    PT Duration 8 weeks    PT Treatment/Interventions ADLs/Self Care Home Management;Aquatic Therapy;Canalith Repostioning;Gait training;Stair training;Functional mobility training;Therapeutic activities;Balance training;Neuromuscular re-education;Therapeutic exercise;Patient/family education;Dry needling;Vestibular;Spinal Manipulations;Joint Manipulations;Electrical Stimulation;Cryotherapy;Passive range of motion;Traction;Moist Heat;Ultrasound;Cognitive remediation;Manual techniques    PT Next Visit Plan continue with low back interventions (gentle stretches, STM, postural correction, strengthening), Continue with strengthening and balance exercises, progress HEP,    PT Home Exercise Plan Medbridge Code: [email or web address]    Consulted and Agree with Plan of Care Patient           Patient will benefit from skilled therapeutic intervention in order to improve the following deficits and impairments:  Abnormal gait, Decreased strength, Dizziness, Difficulty walking, Decreased balance, Decreased coordination, Pain  Visit Diagnosis: Chronic midline low back pain, unspecified whether sciatica present     Problem List Patient Active Problem List   Diagnosis Date Noted  . Lumbar facet arthropathy 02/24/2018  . Chronic pain syndrome  02/24/2018  . 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 History of traumatic brain injury 06/10/2014  . Osteoarthritis of right knee 06/10/2014    Phillips Grout PT, DPT, GCS  Albertine Lafoy 01/05/2020, 3:31 PM  [place] MAIN [facility] SERVICES [address] [place], [place], [address] Phone: [phone]   Fax:  [phone]  Name: Datron Brakebill MRN: [id number] Date of Birth: April 18, 1975

## 2020-01-07 ENCOUNTER — Ambulatory Visit: Payer: Medicare Other

## 2020-01-07 ENCOUNTER — Other Ambulatory Visit: Payer: Self-pay

## 2020-01-07 DIAGNOSIS — R2681 Unsteadiness on feet: Secondary | ICD-10-CM

## 2020-01-07 DIAGNOSIS — M545 Low back pain, unspecified: Secondary | ICD-10-CM

## 2020-01-07 DIAGNOSIS — G8929 Other chronic pain: Secondary | ICD-10-CM

## 2020-01-07 NOTE — Therapy (Signed)
[place] MAIN [facility] SERVICES [address] [place], [place], [address] Phone: [phone]   Fax:  [phone]  Physical Therapy Treatment  Patient Details  Name: Timothy Farley MRN: [id number] Date of Birth: 08/27/1975 Referring Provider (PT): Dr. Bethann Punches   Encounter Date: 01/07/2020   PT End of Session - 01/07/20 1341    Visit Number 19    Number of Visits 33    Date for PT Re-Evaluation 02/15/20    Authorization Type Eval: 10/27/19    PT Start Time 1147    PT Stop Time 1230    PT Time Calculation (min) 43 min    Equipment Utilized During Treatment Gait belt    Activity Tolerance Patient tolerated treatment well    Behavior During Therapy Impulsive           Past Medical History:  Diagnosis Date  . Arthritis   . GERD (gastroesophageal reflux disease)   . Left foot drop   . Pleurisy   . Short-term memory loss   . Subdural hematoma ([place])   . TBI (traumatic brain injury) ([place]) 1994    Past Surgical History:  Procedure Laterality Date  . BRAIN SURGERY    . brain surgery x3    . ESOPHAGOGASTRODUODENOSCOPY (EGD) WITH PROPOFOL N/A 02/19/2018   Procedure: ESOPHAGOGASTRODUODENOSCOPY (EGD) WITH PROPOFOL;  Surgeon: Toledo, Benay Pike, MD;  Location: [facility] ENDOSCOPY;  Service: Gastroenterology;  Laterality: N/A;  . GASTROSTOMY W/ FEEDING TUBE    . trachostemy      There were no vitals filed for this visit.   Subjective Assessment - 01/07/20 1200    Subjective Patient reports that he is doing well today but he is complaining of some increase in his L lateral thigh pain today. He rates his back pain as 4/10 upon arrival. His mom reports that he does actually get up more frequently when playing video games than patient was previously reporting. He reports that his mom was looking into getting a specific lumbar support pillow to use at home in his chair. He denies any falls since the last therapy session.    Pertinent History Patient is a  44 year old male with history of falling/unbalance. He notes that the room is always spinning and he has to hold onto his mom's shirt/arm when walking. He has had 3 falls in the last few months. One of which was taking a step backwards, another forward, and the third his L foot got stuck and he fell forward. He notes that his L knee buckles. He had a TBI when he was younger, and is L side was affected most. He has back pain that goes down his L leg. He used to get Cortizone shots before his insurance stopped paying for them. His goal is to walk down the street confidently without holding on to anything. He notes that he does not want to walk with a cane or walker. 11/26/19: Pt complains of low back pain that has been going on for multiple years, since his mid/late 64s. Initially pain progressively worsened but over the last couple years has stayed relatively consistent. No history of injury, trauma, or surgery. He was seen by Dr. Sharlet Salina with physiatry who was going to do spinal injections but due to patient's anxiety regarding being able to lay safely on his narrow exam table pt was instead referred him to pain management. He has had plain film radiographs of his lumbar spine from [facility] clinic dated 12/24/2017 which showed  mild degenerative findings from L3-S1 most significantly at L5-S1 and facet degenerative changes at L5-S1. No acute findings. Pt underwent 2 rounds of spinal injections with pain management which were helpful for his pain but his insurance wouldn't authorize any additional injections. At the time pt was unwilling to try physical therapy for his back pain however he is interested in pursuing conservative therapy management at this time. He has a remote history of a traumatic brain injury which caused ataxia and weakness to the left side of his body including L foot drop. He is currently being treated at this clinic for his imbalance and would like to include treatment for his low back pain.     Limitations House hold activities;Walking    Patient Stated Goals Walk down the street confidently without holding on to anything    Currently in Pain? Yes    Pain Score 4     Pain Location Back    Pain Orientation Lower    Pain Descriptors / Indicators Aching    Pain Type Chronic pain    Pain Onset More than a month ago    Pain Frequency Constant                TREATMENT   Manual Therapy Supine single knee to chest, FABER, FADIR, distal hamstring, proximal hamstring, hip adductor, and thoracolumbar rotationstretches x 30s hold each bilateral; R sidelying L lumbar gapping mobilization, grade 3, 30s/bout x 3 bouts; Supine R and L lumbar rotational mobilizations, grade 3, 30s/bout x 2 bouts; L1-L5CPA, grade 2-3 mobilizations, 30s/bout x 2boutson each level,pt withlesstenderness and guarding todaycompared to previous sessions; L1-L5L UPAgrade 2-3 mobilizations, 30s/bout x 1 bouton each level; STM to L lumbar paraspinals and quadratus lumborum with long-duration trigger point release; L hip flexor stretch with pt at edge of table and therapist adding overpressure to L hip into additional extension x 30s; Education with patient regarding plan of care as well as lumbar support pillows;    Patient reports that his pain is slightly higher today compared to last session however improved compared to before starting therapy. Session today focused on manual interventions and introduced additional interventions including R sidelying gapping mobilization as well as lumbar rotational mobilizations. Also performed L hip flexor stretch with patient in [place] position. Pt encouraged to continue with lumbar support while sitting in his recliner at home and his mother is looking at purchasing a lumbar support pillow. Pt will need updated outcome measures and goals at next session in addition to a progress note. The plan is for next week to be his last therapy sessions. Ptwould  benefit from additional skilled PT intervention to improve back pain.                          PT Short Term Goals - 12/21/19 1721      PT SHORT TERM GOAL #1   Title Pt will be independent with HEP in order to improve strength and balance in order to decrease fall risk and improve function at home and work.    Baseline 10/27/19: HEP given    Time 4    Period Weeks    Status On-going    Target Date 01/18/20      PT SHORT TERM GOAL #2   Title Patient will be able to perform sit to stands without UE support in order to demonstrate increased functional strength of LEs.    Baseline 10/27/19: Patient unable to  perform sit to stands without UE support or higher chair; 12/01/19: Progressing ability to perform sit to stands from lower heights;    Time 4    Period Weeks    Status Partially Met    Target Date 01/18/20             PT Long Term Goals - 12/21/19 1722      PT LONG TERM GOAL #1   Title Pt will improve BERG by at least 3 points in order to demonstrate clinically significant improvement in balance.    Baseline 10/27/19: 41/56; 12/03/19: 40/56    Time 8    Period Weeks    Status On-going    Target Date 02/15/20      PT LONG TERM GOAL #2   Title Pt will perform 5TSTS in less than 12 seconds in order to decrease risk for falls.    Baseline 10/27/19: Unable to perform due to UE support; 12/01/19: Still requires elevated surface but height is lowering;    Time 8    Period Weeks    Status Partially Met    Target Date 02/15/20      PT LONG TERM GOAL #3   Title Pt will improve ABC by at least 13% in order to demonstrate clinically significant improvement in balance confidence.    Baseline 10/27/19: 51.25%; 12/03/19: 31.25%;    Time 8    Period Weeks    Status On-going    Target Date 02/15/20      PT LONG TERM GOAL #4   Title Patient will increase FOTO score to equal to or greater than 55 to demonstrate statistically significant improvement in mobility and  quality of life    Baseline 10/27/19: 50; 12/03/19: 36    Time 8    Period Weeks    Status On-going    Target Date 02/15/20      PT LONG TERM GOAL #5   Title Patient will improve 10MWT self-selected pace to 1.51ms in order to be considered a full community ambulator.    Baseline 10/27/19: self-selected: 0.85 m/s, fastest: 1.555m; 12/03/19: self-selected: 10.2s = 0.9880m fastest: 6.7s = 1.9m6m  Time 8    Period Weeks    Status Partially Met    Target Date 02/15/20      Additional Long Term Goals   Additional Long Term Goals Yes      PT LONG TERM GOAL #6   Title Pt will decrease mODI scoreby at least 13 points in order demonstrate clinically significant reduction in pain/disability    Baseline 12/03/19: 42%    Time 8    Period Weeks    Status On-going    Target Date 02/15/20                 Plan - 01/07/20 1342    Clinical Impression Statement Patient reports that his pain is slightly higher today compared to last session however improved compared to before starting therapy. Session today focused on manual interventions and introduced additional interventions including R sidelying gapping mobilization as well as lumbar rotational mobilizations. Also performed L hip flexor stretch with patient in [place]. Pt encouraged to continue with lumbar support while sitting in his recliner at home and his mother is looking at purchasing a lumbar support pillow. Pt will need updated outcome measures and goals at next session in addition to a progress note. The plan is for next week to be his last therapy sessions. Pt would benefit  from additional skilled PT intervention to improve back pain.    Personal Factors and Comorbidities Comorbidity 3+;Time since onset of injury/illness/exacerbation;Past/Current Experience    Comorbidities TBI, GERD, cognitive deficits, back pain    Examination-Activity Limitations Locomotion Level;Reach Overhead;Bathing;Stairs;Caring for Others     Examination-Participation Restrictions Driving;Community Activity;Meal Prep;Occupation;Volunteer;Yard Work    [employer] officer Evolving/Moderate complexity    Rehab Potential Fair    PT Frequency 2x / week    PT Duration 8 weeks    PT Treatment/Interventions ADLs/Self Care Home Management;Aquatic Therapy;Canalith Repostioning;Gait training;Stair training;Functional mobility training;Therapeutic activities;Balance training;Neuromuscular re-education;Therapeutic exercise;Patient/family education;Dry needling;Vestibular;Spinal Manipulations;Joint Manipulations;Electrical Stimulation;Cryotherapy;Passive range of motion;Traction;Moist Heat;Ultrasound;Cognitive remediation;Manual techniques    PT Next Visit Plan continue with low back interventions (gentle stretches, STM, postural correction, strengthening), Continue with strengthening and balance exercises, progress HEP,    PT Home Exercise Plan Medbridge Code: [email or web address]    Consulted and Agree with Plan of Care Patient           Patient will benefit from skilled therapeutic intervention in order to improve the following deficits and impairments:  Abnormal gait, Decreased strength, Dizziness, Difficulty walking, Decreased balance, Decreased coordination, Pain  Visit Diagnosis: Chronic midline low back pain, unspecified whether sciatica present  Unsteadiness on feet     Problem List Patient Active Problem List   Diagnosis Date Noted  . Lumbar facet arthropathy 02/24/2018  . Chronic pain syndrome 02/24/2018  . 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 History of traumatic brain injury 06/10/2014  . Osteoarthritis of right knee 06/10/2014   Phillips Grout PT, DPT, GCS  Mitchelle Sultan 01/07/2020, 2:00 PM  [place] MAIN [facility] SERVICES [address] [place], [place], [address] Phone: [phone]   Fax:  [phone]  Name:  Timothy Farley MRN: [id number] Date of Birth: August 12, 1975

## 2020-01-12 ENCOUNTER — Other Ambulatory Visit: Payer: Self-pay

## 2020-01-12 ENCOUNTER — Ambulatory Visit: Payer: Medicare Other | Attending: Internal Medicine

## 2020-01-12 DIAGNOSIS — M545 Low back pain, unspecified: Secondary | ICD-10-CM | POA: Diagnosis not present

## 2020-01-12 DIAGNOSIS — R2681 Unsteadiness on feet: Secondary | ICD-10-CM | POA: Insufficient documentation

## 2020-01-12 DIAGNOSIS — G8929 Other chronic pain: Secondary | ICD-10-CM | POA: Insufficient documentation

## 2020-01-12 NOTE — Therapy (Signed)
[place] MAIN [facility] SERVICES [address] [place], [place], [address] Phone: [phone]   Fax:  [phone]  Physical Therapy Treatment/Progress Note Dates of reporting period  12/01/19   to   01/12/20  Patient Details  Name: Timothy Farley MRN: [id number] Date of Birth: 1976/01/24 Referring Provider (PT): Dr. 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   Subjective Assessment - 01/12/20 1457    Subjective Patient reports that he is doing well today. He rates his back pain as 3-4/10 upon arrival. Reports that his back pain feels at least 20% improved compared to when he started therapy. He did have a "close call" and almost fell since the last therapy session but did not fall. He continues to complain of the most back pain when transitioning from sitting to standing.    Pertinent History Patient is a 44 year old male with history of falling/unbalance. He  notes that the room is always spinning and he has to hold onto his mom's shirt/arm when walking. He has had 3 falls in the last few months. One of which was taking a step backwards, another forward, and the third his L foot got stuck and he fell forward. He notes that his L knee buckles. He had a TBI when he was younger, and is L side was affected most. He has back pain that goes down his L leg. He used to get Cortizone shots before his insurance stopped paying for them. His goal is to walk down the street confidently without holding on to anything. He notes that he does not want to walk with a cane or walker. 11/26/19: Pt complains of low back pain that has been going on for multiple years, since his mid/late 74s. Initially pain progressively worsened but over the last couple years has stayed relatively consistent. No history of injury, trauma, or surgery. He was seen by Dr. Sharlet Salina with physiatry who was going to do spinal injections but due to patient's anxiety regarding being able to lay safely on his narrow exam table pt was instead referred him to pain management. He has had plain film radiographs of his lumbar spine from [facility] clinic dated 12/24/2017 which showed mild degenerative findings from L3-S1 most significantly at L5-S1  and facet degenerative changes at L5-S1. No acute findings. Pt underwent 2 rounds of spinal injections with pain management which were helpful for his pain but his insurance wouldn't authorize any additional injections. At the time pt was unwilling to try physical therapy for his back pain however he is interested in pursuing conservative therapy management at this time. He has a remote history of a traumatic brain injury which caused ataxia and weakness to the left side of his body including L foot drop. He is currently being treated at this clinic for his imbalance and would like to include treatment for his low back pain.    Limitations House hold activities;Walking    Patient  Stated Goals Walk down the street confidently without holding on to anything    Currently in Pain? Yes    Pain Score 4     Pain Location Back    Pain Orientation Lower    Pain Descriptors / Indicators Aching    Pain Type Chronic pain    Pain Onset More than a month ago    Pain Frequency Constant                 TREATMENT   Manual Therapy MODI: 28% (pt requires assistance to complete questionnaire); Supine single knee to chest, FABER, FADIR, distal hamstring, proximal hamstring, hip adductor, and thoracolumbar rotationstretches x 30s hold each bilateral; L1-L5CPA, grade 2-3 mobilizations, 30s/bout x 2boutson each level,pt withlesstenderness and guarding again todaycompared to previous sessions; L1-L5L UPAgrade 2-3 mobilizations, 30s/bout x 1 bouton each level; STM to L lumbar paraspinals and quadratus lumborum with long-duration trigger point release; Education with patient regarding plan of care and discharge at next visit.   Goals updated with patient today. He reports that overall his back pain is at least 20% improved compared to when he first started coming in for therapy.  Upon starting therapy for low back pain patient would report that his pain occasionally reach 10/10.  He states that his back pain has reached 7/10 once over the course of the last week but has not gotten any worse. His mODI has decreased from 42% initially to 28% today indicating a significant decrease in his disability related to his back pain.  Rest of the session focused on manual techniques to help decrease back pain.  Patient encouraged to continue with lower extremity strengthening exercises to improve hip strength and decrease strain on low back.  Will review HEP at next session and patient will be discharged.                           PT Short Term Goals - 01/12/20 1459      PT SHORT TERM GOAL #1   Title Pt will be independent with HEP in order to improve  strength and balance in order to decrease fall risk and improve function at home and work.    Baseline 10/27/19: HEP given    Time 4    Period Weeks    Status Deferred      PT SHORT TERM GOAL #2   Title Patient will be able to perform sit to stands without UE support in order to demonstrate increased functional strength of LEs.    Baseline 10/27/19: Patient unable to perform sit to stands without UE support or higher chair; 12/01/19: Progressing ability to perform sit to stands from lower heights;    Time 4    Period Weeks    Status  Deferred             PT Long Term Goals - 01/12/20 1517      PT LONG TERM GOAL #1   Title Pt will improve BERG by at least 3 points in order to demonstrate clinically significant improvement in balance.    Baseline 10/27/19: 41/56; 12/03/19: 40/56    Time 8    Period Weeks    Status Deferred      PT LONG TERM GOAL #2   Title Pt will perform 5TSTS in less than 12 seconds in order to decrease risk for falls.    Baseline 10/27/19: Unable to perform due to UE support; 12/01/19: Still requires elevated surface but height is lowering;    Time 8    Period Weeks    Status Deferred      PT LONG TERM GOAL #3   Title Pt will improve ABC by at least 13% in order to demonstrate clinically significant improvement in balance confidence.    Baseline 10/27/19: 51.25%; 12/03/19: 31.25%;    Time 8    Period Weeks    Status Deferred      PT LONG TERM GOAL #4   Title Patient will increase FOTO score to equal to or greater than 55 to demonstrate statistically significant improvement in mobility and quality of life    Baseline 10/27/19: 50; 12/03/19: 36    Time 8    Period Weeks    Status Deferred      PT LONG TERM GOAL #5   Title Patient will improve 10MWT self-selected pace to 1.27m/s in order to be considered a full community ambulator.    Baseline 10/27/19: self-selected: 0.85 m/s, fastest: 1.82m/s; 12/03/19: self-selected: 10.2s = 0.41m/s, fastest: 6.7s = 1.68m/s     Time 8    Period Weeks    Status Deferred      PT LONG TERM GOAL #6   Title Pt will decrease mODI scoreby at least 13 points in order demonstrate clinically significant reduction in pain/disability    Baseline 12/03/19: 42%; 01/12/20: 28%    Time 8    Period Weeks    Status On-going                 Plan - 01/12/20 1458    Clinical Impression Statement Goals updated with patient today. He reports that overall his back pain is at least 20% improved compared to when he first started coming in for therapy.  Upon starting therapy for low back pain patient would report that his pain occasionally reach 10/10.  He states that his back pain has reached 7/10 once over the course of the last week but has not gotten any worse. His mODI has decreased from 42% initially to 28% today indicating a significant decrease in his disability related to his back pain.  Rest of the session focused on manual techniques to help decrease back pain.  Patient encouraged to continue with lower extremity strengthening exercises to improve hip strength and decrease strain on low back.  Will review HEP at next session and patient will be discharged.    Personal Factors and Comorbidities Comorbidity 3+;Time since onset of injury/illness/exacerbation;Past/Current Experience    Comorbidities TBI, GERD, cognitive deficits, back pain    Examination-Activity Limitations Locomotion Level;Reach Overhead;Bathing;Stairs;Caring for Others    Examination-Participation Restrictions Driving;Community Activity;Meal Prep;Occupation;Volunteer;Yard Work    Product manager    Rehab Potential Fair    PT Frequency 2x / week  PT Duration 8 weeks    PT Treatment/Interventions ADLs/Self Care Home Management;Aquatic Therapy;Canalith Repostioning;Gait training;Stair training;Functional mobility training;Therapeutic activities;Balance training;Neuromuscular re-education;Therapeutic  exercise;Patient/family education;Dry needling;Vestibular;Spinal Manipulations;Joint Manipulations;Electrical Stimulation;Cryotherapy;Passive range of motion;Traction;Moist Heat;Ultrasound;Cognitive remediation;Manual techniques    PT Next Visit Plan Discharge    PT Home Exercise Plan Medbridge Code: [email or web address]    Consulted and Agree with Plan of Care Patient           Patient will benefit from skilled therapeutic intervention in order to improve the following deficits and impairments:  Abnormal gait, Decreased strength, Dizziness, Difficulty walking, Decreased balance, Decreased coordination, Pain  Visit Diagnosis: Chronic midline low back pain, unspecified whether sciatica present     Problem List Patient Active Problem List   Diagnosis Date Noted   Lumbar facet arthropathy 02/24/2018   Chronic pain syndrome 02/24/2018   Ataxia 08/15/2016   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 [place], [place], [address] Phone: [phone]   Fax:  [phone]  Name: Ewing Fandino MRN: [id number] Date of Birth: 11/21/1975

## 2020-01-14 ENCOUNTER — Other Ambulatory Visit: Payer: Self-pay

## 2020-01-14 ENCOUNTER — Ambulatory Visit: Payer: Medicare Other

## 2020-01-14 DIAGNOSIS — G8929 Other chronic pain: Secondary | ICD-10-CM

## 2020-01-14 DIAGNOSIS — M545 Low back pain, unspecified: Secondary | ICD-10-CM | POA: Diagnosis not present

## 2020-01-14 DIAGNOSIS — R2681 Unsteadiness on feet: Secondary | ICD-10-CM

## 2020-01-15 NOTE — Therapy (Signed)
[place] MAIN [facility] SERVICES [address] [place], [place], [address] Phone: [phone]   Fax:  [phone]  Physical Therapy Treatment/Discharge  Patient Details  Name: Timothy Farley MRN: [id number] Date of Birth: April 30, 1975 Referring Provider (PT): Dr. Bethann Punches   Encounter Date: 01/14/2020   PT End of Session - 01/14/20 1527    Visit Number 21    Number of Visits 33    Date for PT Re-Evaluation 02/15/20    Authorization Type Eval: 10/27/19    PT Start Time 1520    PT Stop Time 1600    PT Time Calculation (min) 40 min    Equipment Utilized During Treatment Gait belt    Activity Tolerance Patient tolerated treatment well           Past Medical History:  Diagnosis Date  . Arthritis   . GERD (gastroesophageal reflux disease)   . Left foot drop   . Pleurisy   . Short-term memory loss   . 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 TBI (traumatic brain injury) ([place]) 1994    Past Surgical History:  Procedure Laterality Date  . BRAIN SURGERY    . brain surgery x3    . ESOPHAGOGASTRODUODENOSCOPY (EGD) WITH PROPOFOL N/A 02/19/2018   Procedure: ESOPHAGOGASTRODUODENOSCOPY (EGD) WITH PROPOFOL;  Surgeon: Toledo, Benay Pike, MD;  Location: [facility] ENDOSCOPY;  Service: Gastroenterology;  Laterality: N/A;  . GASTROSTOMY W/ FEEDING TUBE    . trachostemy      There were no vitals filed for this visit.   Subjective Assessment - 01/14/20 1525    Subjective Patient reports that he is doing well today. He rates his back pain as 3-4/10 upon arrival. Reports daily "stumbles" but no falls since last therapy session. No specific questions or concerns currently.    Pertinent History Patient is a 44 year old male with history of falling/unbalance. He notes that the room is always spinning and he has to hold onto his mom's shirt/arm when walking. He has had 3 falls in the last few months. One of which was taking a step backwards, another forward, and the third his L  foot got stuck and he fell forward. He notes that his L knee buckles. He had a TBI when he was younger, and is L side was affected most. He has back pain that goes down his L leg. He used to get Cortizone shots before his insurance stopped paying for them. His goal is to walk down the street confidently without holding on to anything. He notes that he does not want to walk with a cane or walker. 11/26/19: Pt complains of low back pain that has been going on for multiple years, since his mid/late 17s. Initially pain progressively worsened but over the last couple years has stayed relatively consistent. No history of injury, trauma, or surgery. He was seen by Dr. Sharlet Salina with physiatry who was going to do spinal injections but due to patient's anxiety regarding being able to lay safely on his narrow exam table pt was instead referred him to pain management. He has had plain film radiographs of his lumbar spine from [facility] clinic dated 12/24/2017 which showed mild degenerative findings from L3-S1 most significantly at L5-S1 and facet degenerative changes at L5-S1. No acute findings. Pt underwent 2 rounds of spinal injections with pain management which were helpful for his pain but his insurance wouldn't authorize any additional injections. At the time pt was unwilling to try physical therapy for his back  pain however he is interested in pursuing conservative therapy management at this time. He has a remote history of a traumatic brain injury which caused ataxia and weakness to the left side of his body including L foot drop. He is currently being treated at this clinic for his imbalance and would like to include treatment for his low back pain.    Limitations House hold activities;Walking    Patient Stated Goals Walk down the street confidently without holding on to anything    Currently in Pain? Yes    Pain Score 4     Pain Location Back    Pain Orientation Lower    Pain Descriptors / Indicators Aching     Pain Type Chronic pain    Pain Onset More than a month ago    Pain Frequency Constant               TREATMENT   Manual Therapy Supine single knee to chest, FABER, FADIR, distal hamstring, proximal hamstring, hip adductor, and thoracolumbar rotationstretches x 30s hold each bilateral, heat under lower lumbar spine during stretches; Prone L1-L5CPA, grade 2-3 mobilizations, 30s/bout x 2boutson each level; Prone L1-L5L UPAgrade 2-3 mobilizations, 30s/bout x 1 bouton each level; R sidelying L lumbar gapping mobilization, grade 3, 30s/bout x 3 bouts; Supine R and L lumbar rotational mobilizations, grade 3, 30s/bout x 2 bouts; STM to L lumbar paraspinals and quadratus lumborum with long-duration trigger point release; Biofreeze applied to lower lumbar spine at end of session; Reviewed goals with patient, indications for return if necessary, HEP, and discharge;   Goals updated with patient during last session. He reports that overall his back pain is at least 20% improved compared to when he first started coming in for therapy.  Upon starting therapy for low back pain patient would report that his pain occasionally would reach 10/10.  He states that his back pain has reached 7/10 at its worst recently. His mODI has decreased from 42% initially to 28% at last session indicating a significant decrease in his disability related to his back pain. Continued with manual therapy during session today. Reviewed goals with patient, indications for how and when to return if necssary, and his HEP. Pt is ready for discharge on this date.                             PT Short Term Goals - 01/15/20 0912      PT SHORT TERM GOAL #1   Title Pt will be independent with HEP in order to improve strength and balance in order to decrease fall risk and improve function at home and work.    Baseline 10/27/19: HEP given; 01/14/20: Pt is doing his home stretches    Time 4    Period  Weeks    Status Achieved      PT SHORT TERM GOAL #2   Title Patient will be able to perform sit to stands without UE support in order to demonstrate increased functional strength of LEs.    Baseline 10/27/19: Patient unable to perform sit to stands without UE support or higher chair; 12/01/19: Progressing ability to perform sit to stands from lower heights;    Time 4    Period Weeks    Status Deferred             PT Long Term Goals - 01/15/20 0912      PT LONG TERM GOAL #1  Title Pt will improve BERG by at least 3 points in order to demonstrate clinically significant improvement in balance.    Baseline 10/27/19: 41/56; 12/03/19: 40/56    Time 8    Period Weeks    Status Deferred      PT LONG TERM GOAL #2   Title Pt will perform 5TSTS in less than 12 seconds in order to decrease risk for falls.    Baseline 10/27/19: Unable to perform due to UE support; 12/01/19: Still requires elevated surface but height is lowering;    Time 8    Period Weeks    Status Deferred      PT LONG TERM GOAL #3   Title Pt will improve ABC by at least 13% in order to demonstrate clinically significant improvement in balance confidence.    Baseline 10/27/19: 51.25%; 12/03/19: 31.25%;    Time 8    Period Weeks    Status Deferred      PT LONG TERM GOAL #4   Title Patient will increase FOTO score to equal to or greater than 55 to demonstrate statistically significant improvement in mobility and quality of life    Baseline 10/27/19: 50; 12/03/19: 36    Time 8    Period Weeks    Status Deferred      PT LONG TERM GOAL #5   Title Patient will improve 10MWT self-selected pace to 1.56m/s in order to be considered a full community ambulator.    Baseline 10/27/19: self-selected: 0.85 m/s, fastest: 1.68m/s; 12/03/19: self-selected: 10.2s = 0.64m/s, fastest: 6.7s = 1.85m/s    Time 8    Period Weeks    Status Deferred      PT LONG TERM GOAL #6   Title Pt will decrease mODI scoreby at least 13 points in order  demonstrate clinically significant reduction in pain/disability    Baseline 12/03/19: 42%; 01/12/20: 28%    Time 8    Period Weeks    Status Achieved                 Plan - 01/14/20 1527    Clinical Impression Statement Goals updated with patient during last session. He reports that overall his back pain is at least 20% improved compared to when he first started coming in for therapy.  Upon starting therapy for low back pain patient would report that his pain occasionally would reach 10/10.  He states that his back pain has reached 7/10 at its worst recently. His mODI has decreased from 42% initially to 28% at last session indicating a significant decrease in his disability related to his back pain. Continued with manual therapy during session today. Reviewed goals with patient, indications for how and when to return if necssary, and his HEP. Pt is ready for discharge on this date.    Personal Factors and Comorbidities Comorbidity 3+;Time since onset of injury/illness/exacerbation;Past/Current Experience    Comorbidities TBI, GERD, cognitive deficits, back pain    Examination-Activity Limitations Locomotion Level;Reach Overhead;Bathing;Stairs;Caring for Others    Examination-Participation Restrictions Driving;Community Activity;Meal Prep;Occupation;Volunteer;Yard Work    Merchant navy officer Evolving/Moderate complexity    Rehab Potential Fair    PT Frequency 2x / week    PT Duration 8 weeks    PT Treatment/Interventions ADLs/Self Care Home Management;Aquatic Therapy;Canalith Repostioning;Gait training;Stair training;Functional mobility training;Therapeutic activities;Balance training;Neuromuscular re-education;Therapeutic exercise;Patient/family education;Dry needling;Vestibular;Spinal Manipulations;Joint Manipulations;Electrical Stimulation;Cryotherapy;Passive range of motion;Traction;Moist Heat;Ultrasound;Cognitive remediation;Manual techniques    PT Next Visit Plan  Discharged    PT Home Exercise Plan Medbridge Code:  [email or web address]    Consulted and Agree with Plan of Care Patient           Patient will benefit from skilled therapeutic intervention in order to improve the following deficits and impairments:  Abnormal gait, Decreased strength, Dizziness, Difficulty walking, Decreased balance, Decreased coordination, Pain  Visit Diagnosis: Chronic midline low back pain, unspecified whether sciatica present  Unsteadiness on feet     Problem List Patient Active Problem List   Diagnosis Date Noted  . Lumbar facet arthropathy 02/24/2018  . Chronic pain syndrome 02/24/2018  . 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 History of traumatic brain injury 06/10/2014  . Osteoarthritis of right knee 06/10/2014   Phillips Grout PT, DPT, GCS  Malea Swilling 01/15/2020, 9:21 AM  [place] MAIN [facility] SERVICES [address] [place], [place], [address] Phone: [phone]   Fax:  [phone]  Name: Timothy Farley MRN: [id number] Date of Birth: 08/13/1975

## 2020-01-20 ENCOUNTER — Ambulatory Visit: Payer: Medicare Other

## 2020-01-26 ENCOUNTER — Ambulatory Visit: Payer: Medicare Other

## 2020-01-28 ENCOUNTER — Ambulatory Visit: Payer: Medicare Other

## 2020-02-01 ENCOUNTER — Ambulatory Visit (INDEPENDENT_AMBULATORY_CARE_PROVIDER_SITE_OTHER): Payer: Medicare Other | Admitting: Dermatology

## 2020-02-01 ENCOUNTER — Encounter: Payer: Self-pay | Admitting: Dermatology

## 2020-02-01 ENCOUNTER — Ambulatory Visit: Payer: Medicare Other

## 2020-02-01 ENCOUNTER — Other Ambulatory Visit: Payer: Self-pay

## 2020-02-01 DIAGNOSIS — L905 Scar conditions and fibrosis of skin: Secondary | ICD-10-CM

## 2020-02-01 DIAGNOSIS — D229 Melanocytic nevi, unspecified: Secondary | ICD-10-CM

## 2020-02-01 DIAGNOSIS — Z1283 Encounter for screening for malignant neoplasm of skin: Secondary | ICD-10-CM | POA: Diagnosis not present

## 2020-02-01 DIAGNOSIS — D1800 Hemangioma unspecified site: Secondary | ICD-10-CM

## 2020-02-01 DIAGNOSIS — L814 Other melanin hyperpigmentation: Secondary | ICD-10-CM | POA: Diagnosis not present

## 2020-02-01 DIAGNOSIS — L219 Seborrheic dermatitis, unspecified: Secondary | ICD-10-CM | POA: Diagnosis not present

## 2020-02-01 DIAGNOSIS — Z86018 Personal history of other benign neoplasm: Secondary | ICD-10-CM

## 2020-02-01 DIAGNOSIS — L578 Other skin changes due to chronic exposure to nonionizing radiation: Secondary | ICD-10-CM

## 2020-02-01 DIAGNOSIS — L821 Other seborrheic keratosis: Secondary | ICD-10-CM

## 2020-02-01 MED ORDER — KETOCONAZOLE 2 % EX SHAM: MEDICATED_SHAMPOO | CUTANEOUS | 11 refills | Status: DC

## 2020-02-01 MED ORDER — KETOCONAZOLE 2 % EX CREA: TOPICAL_CREAM | CUTANEOUS | 3 refills | Status: DC

## 2020-02-01 MED ORDER — HYDROCORTISONE 2.5 % EX CREA: TOPICAL_CREAM | CUTANEOUS | 6 refills | Status: DC
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
## 2020-02-02 ENCOUNTER — Encounter: Payer: Self-pay | Admitting: Dermatology

## 2020-02-03 ENCOUNTER — Ambulatory Visit: Payer: Medicare Other

## 2020-02-08 ENCOUNTER — Ambulatory Visit: Payer: Medicare Other

## 2020-02-10 ENCOUNTER — Ambulatory Visit: Payer: Medicare Other

## 2020-11-18 ENCOUNTER — Other Ambulatory Visit: Payer: Self-pay | Admitting: Neurology

## 2020-11-18 DIAGNOSIS — R270 Ataxia, unspecified: Secondary | ICD-10-CM

## 2020-12-08 ENCOUNTER — Ambulatory Visit: Payer: Medicare Other

## 2020-12-09 ENCOUNTER — Other Ambulatory Visit: Payer: Self-pay

## 2020-12-09 ENCOUNTER — Ambulatory Visit
Admission: RE | Admit: 2020-12-09 | Discharge: 2020-12-09 | Disposition: A | Discharge status: Home / Self Care | Payer: Medicare Other | Source: Ambulatory Visit | Attending: Neurology | Admitting: Neurology

## 2020-12-09 DIAGNOSIS — R270 Ataxia, unspecified: Secondary | ICD-10-CM | POA: Insufficient documentation

## 2020-12-09 LAB — POCT I-STAT CREATININE: Creatinine, Ser: 1 mg/dL (ref 0.61–1.24)

## 2020-12-09 MED ORDER — IOHEXOL 350 MG/ML SOLN
75.0000 mL | Freq: Once | INTRAVENOUS | Status: AC | PRN
  Administered 2020-12-09: 75 mL via INTRAVENOUS

## 2021-02-08 ENCOUNTER — Other Ambulatory Visit: Payer: Self-pay

## 2021-02-08 ENCOUNTER — Ambulatory Visit (INDEPENDENT_AMBULATORY_CARE_PROVIDER_SITE_OTHER): Payer: Medicare Other | Admitting: Dermatology

## 2021-02-08 DIAGNOSIS — Z86018 Personal history of other benign neoplasm: Secondary | ICD-10-CM

## 2021-02-08 DIAGNOSIS — L219 Seborrheic dermatitis, unspecified: Secondary | ICD-10-CM | POA: Diagnosis not present

## 2021-02-08 DIAGNOSIS — L905 Scar conditions and fibrosis of skin: Secondary | ICD-10-CM | POA: Diagnosis not present

## 2021-02-08 DIAGNOSIS — Z1283 Encounter for screening for malignant neoplasm of skin: Secondary | ICD-10-CM | POA: Diagnosis not present

## 2021-02-08 DIAGNOSIS — L821 Other seborrheic keratosis: Secondary | ICD-10-CM

## 2021-02-08 DIAGNOSIS — L578 Other skin changes due to chronic exposure to nonionizing radiation: Secondary | ICD-10-CM

## 2021-02-08 DIAGNOSIS — D229 Melanocytic nevi, unspecified: Secondary | ICD-10-CM

## 2021-02-08 DIAGNOSIS — L814 Other melanin hyperpigmentation: Secondary | ICD-10-CM

## 2021-02-08 DIAGNOSIS — D1800 Hemangioma unspecified site: Secondary | ICD-10-CM

## 2021-02-08 MED ORDER — KETOCONAZOLE 2 % EX SHAM: MEDICATED_SHAMPOO | CUTANEOUS | 11 refills | Status: DC
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
## 2021-02-08 NOTE — Patient Instructions (Signed)

## 2021-02-14 ENCOUNTER — Encounter: Payer: Self-pay | Admitting: Dermatology

## 2022-02-14 ENCOUNTER — Ambulatory Visit (INDEPENDENT_AMBULATORY_CARE_PROVIDER_SITE_OTHER): Payer: Medicare Other | Admitting: Dermatology

## 2022-02-14 DIAGNOSIS — Z1283 Encounter for screening for malignant neoplasm of skin: Secondary | ICD-10-CM

## 2022-02-14 DIAGNOSIS — Z86018 Personal history of other benign neoplasm: Secondary | ICD-10-CM

## 2022-02-14 DIAGNOSIS — D229 Melanocytic nevi, unspecified: Secondary | ICD-10-CM

## 2022-02-14 DIAGNOSIS — L219 Seborrheic dermatitis, unspecified: Secondary | ICD-10-CM

## 2022-02-14 DIAGNOSIS — L814 Other melanin hyperpigmentation: Secondary | ICD-10-CM

## 2022-02-14 DIAGNOSIS — Z79899 Other long term (current) drug therapy: Secondary | ICD-10-CM

## 2022-02-14 DIAGNOSIS — L821 Other seborrheic keratosis: Secondary | ICD-10-CM | POA: Diagnosis not present

## 2022-02-14 DIAGNOSIS — L578 Other skin changes due to chronic exposure to nonionizing radiation: Secondary | ICD-10-CM

## 2022-02-14 MED ORDER — KETOCONAZOLE 2 % EX CREA
TOPICAL_CREAM | CUTANEOUS | 6 refills | Status: DC
Start: 2022-02-14 — End: 2023-02-20

## 2022-02-14 MED ORDER — KETOCONAZOLE 2 % EX SHAM: MEDICATED_SHAMPOO | CUTANEOUS | 6 refills | Status: DC

## 2022-02-14 NOTE — Patient Instructions (Addendum)
For face and scalp  Continue Ketoconazole 2% shampoo to the scalp, and trunk 2-3 d/wk. Let sit 5-10 minutes before washing off.    Start Ketoconazole 2% cream Mon, Wed, Fri alternating with over the counter eczema cream   Due to recent changes in healthcare laws, you may see results of your pathology and/or laboratory studies on MyChart before the doctors have had a chance to review them. We understand that in some cases there may be results that are confusing or concerning to you. Please understand that not all results are received at the same time and often the doctors may need to interpret multiple results in order to provide you with the best plan of care or course of treatment. Therefore, we ask that you please give [place] 2 business days to thoroughly review all your results before contacting the office for clarification. Should we see a critical lab result, you will be contacted sooner.   If You Need Anything After Your Visit  If you have any questions or concerns for your doctor, please call our main line at [phone] and press option 4 to reach your doctor's medical assistant. If no one answers, please leave a voicemail as directed and we will return your call as soon as possible. Messages left after 4 pm will be answered the following business day.   You may also send [place] a message via [place]. We typically respond to MyChart messages within 1-2 business days.  For prescription refills, please ask your pharmacy to contact our office. Our fax number is [phone].  If you have an urgent issue when the clinic is closed that cannot wait until the next business day, you can page your doctor at the number below.    Please note that while we do our best to be available for urgent issues outside of office hours, we are not available 24/7.   If you have an urgent issue and are unable to reach [place], you may choose to seek medical care at your doctor's office, retail clinic, urgent care center, or  emergency room.  If you have a medical emergency, please immediately call 911 or go to the emergency department.  Pager Numbers  - Dr. Nehemiah Massed: [phone]  - Dr. Laurence Ferrari: [phone]  - Dr. Nicole Kindred: [phone]  In the event of inclement weather, please call our main line at [phone] for an update on the status of any delays or closures.  Dermatology Medication Tips: Please keep the boxes that topical medications come in in order to help keep track of the instructions about where and how to use these. Pharmacies typically print the medication instructions only on the boxes and not directly on the medication tubes.   If your medication is too expensive, please contact our office at [phone] option 4 or send [place] a message through [place].   We are unable to tell what your co-pay for medications will be in advance as this is different depending on your insurance coverage. However, we may be able to find a substitute medication at lower cost or fill out paperwork to get insurance to cover a needed medication.   If a prior authorization is required to get your medication covered by your insurance company, please allow [place] 1-2 business days to complete this process.  Drug prices often vary depending on where the prescription is filled and some pharmacies may offer cheaper prices.  The website www.goodrx.com contains coupons for medications through different pharmacies. The prices here do not account for what  the cost may be with help from insurance (it may be cheaper with your insurance), but the website can give you the price if you did not use any insurance.  - You can print the associated coupon and take it with your prescription to the pharmacy.  - You may also stop by our office during regular business hours and pick up a GoodRx coupon card.  - If you need your prescription sent electronically to a different pharmacy, notify our office through [facility] or by phone at  [phone] option 4.     Si Usted Necesita Algo Despus de Su Visita  Tambin puede enviarnos un mensaje a travs de Pharmacist, community. Por lo general respondemos a los mensajes de MyChart en el transcurso de 1 a 2 das hbiles.  Para renovar recetas, por favor pida a su farmacia que se ponga en contacto con nuestra oficina. Harland Dingwall de fax es [place] [phone].  Si tiene un asunto urgente cuando la clnica est cerrada y que no puede esperar hasta el siguiente da hbil, puede llamar/localizar a su doctor(a) al nmero que aparece a continuacin.   Por favor, tenga en cuenta que aunque hacemos todo lo posible para estar disponibles para asuntos urgentes fuera del horario de [place], no estamos disponibles las 24 horas del da, los 7 das de la [place].   Si tiene un problema urgente y no puede comunicarse con nosotros, puede optar por buscar atencin mdica  en el consultorio de su doctor(a), en una clnica privada, en un centro de atencin urgente o en una sala de emergencias.  Si tiene Engineering geologist, por favor llame inmediatamente al 911 o vaya a la sala de emergencias.  Nmeros de bper  - Dr. Nehemiah Massed: [phone]  - Dra. Moye: [phone]  - Dra. Nicole Kindred: [phone]  En caso de inclemencias del [place], por favor llame a Johnsie Kindred principal al [phone] para una actualizacin sobre el [place] de cualquier retraso o cierre.  Consejos para la medicacin en dermatologa: Por favor, guarde las cajas en las que vienen los medicamentos de uso tpico para ayudarle a seguir las instrucciones sobre dnde y cmo usarlos. Las farmacias generalmente imprimen las instrucciones del medicamento slo en las cajas y no directamente en los tubos del [place].   Si su medicamento es muy caro, por favor, pngase en contacto con Zigmund Daniel llamando al [phone] y presione la opcin 4 o envenos un mensaje a travs de Pharmacist, community.   No podemos decirle cul ser su copago por los  medicamentos por adelantado ya que esto es diferente dependiendo de la cobertura de su seguro. Sin embargo, es posible que podamos encontrar un medicamento sustituto a Electrical engineer un formulario para que el seguro cubra el medicamento que se considera necesario.   Si se requiere una autorizacin previa para que su compaa de seguros [place] su medicamento, por favor permtanos de 1 a 2 das hbiles para completar este proceso.  Los precios de los medicamentos varan con frecuencia dependiendo del Environmental consultant de dnde se surte la receta y alguna farmacias pueden ofrecer precios ms baratos.  El sitio web www.goodrx.com tiene cupones para medicamentos de Airline pilot. Los precios aqu no tienen en cuenta lo que podra costar con la ayuda del seguro (puede ser ms barato con su seguro), pero el sitio web puede darle el precio si no utiliz Research scientist (physical sciences).  - Puede imprimir el cupn correspondiente y llevarlo con su receta a la farmacia.  - Tambin puede pasar  pasar por nuestra oficina durante el horario de atencin regular y recoger una tarjeta de cupones de GoodRx.  - Si necesita que su receta se enve electrnicamente a una farmacia diferente, informe a nuestra oficina a travs de MyChart de [place] o por telfono llamando al [phone] y presione la opcin 4.  

## 2022-02-14 NOTE — Progress Notes (Signed)
Follow-Up Visit   Subjective  Timothy Farley is a 46 y.o. male who presents for the following: Annual Exam. Hx of Dysplastic nevus on the right lateral deltoid.  The patient presents for Total-Body Skin Exam (TBSE) for skin cancer screening and mole check.  The patient has spots, moles and lesions to be evaluated, some may be new or changing and the patient has concerns that these could be cancer.  Recheck rash on his face treating with Ketoconazole shampoo, tried Hydrocortisone cream in the past no help, patient using otc eczema cream for his face with a good response.   The following portions of the chart were reviewed this encounter and updated as appropriate:   Tobacco  Allergies  Meds  Problems  Med Hx  Surg Hx  Fam Hx     Review of Systems:  No other skin or systemic complaints except as noted in HPI or Assessment and Plan.  Objective  Well appearing patient in no apparent distress; mood and affect are within normal limits.  A full examination was performed including scalp, head, eyes, ears, nose, lips, neck, chest, axillae, abdomen, back, buttocks, bilateral upper extremities, bilateral lower extremities, hands, feet, fingers, toes, fingernails, and toenails. All findings within normal limits unless otherwise noted below.  face,scalp,chest Pink patches with greasy scale.    Assessment & Plan  Seborrheic dermatitis face,scalp,chest Seborrheic Dermatitis  -  is a chronic persistent rash characterized by pinkness and scaling most commonly of the mid face but also can occur on the scalp (dandruff), ears; mid chest, mid back and groin.  It tends to be exacerbated by stress and cooler weather.  People who have neurologic disease may experience new onset or exacerbation of existing seborrheic dermatitis.  The condition is not curable but treatable and can be controlled.   Continue Ketoconazole 2% shampoo to the scalp, and trunk 2-3 d/wk. Let sit 5-10 minutes before washing off.   Start Ketoconazole 2% cream M, W, F alternating with HC 2.5% cream on T, Th, Sat QHS.    ketoconazole (NIZORAL) 2 % cream - face,scalp,chest Apply to face once a day Mon, Wed, Fri Related Medications ketoconazole (NIZORAL) 2 % shampoo Shampoo into the scalp and on the body let sit 5-10 minutes then wash off. Use 3 times per week.  Lentigines - Scattered tan macules - Due to sun exposure - Benign-appearing, observe - Recommend daily broad spectrum sunscreen SPF 30+ to sun-exposed areas, reapply every 2 hours as needed. - Call for any changes  Seborrheic Keratoses - Stuck-on, waxy, tan-brown papules and/or plaques  - Benign-appearing - Discussed benign etiology and prognosis. - Observe - Call for any changes  Melanocytic Nevi - Tan-brown and/or pink-flesh-colored symmetric macules and papules - Benign appearing on exam today - Observation - Call clinic for new or changing moles - Recommend daily use of broad spectrum spf 30+ sunscreen to sun-exposed areas.   Hemangiomas - Red papules - Discussed benign nature - Observe - Call for any changes  Actinic Damage - Chronic condition, secondary to cumulative UV/sun exposure - diffuse scaly erythematous macules with underlying dyspigmentation - Recommend daily broad spectrum sunscreen SPF 30+ to sun-exposed areas, reapply every 2 hours as needed.  - Staying in the shade or wearing long sleeves, sun glasses (UVA+UVB protection) and wide brim hats (4-inch brim around the entire circumference of the hat) are also recommended for sun protection.  - Call for new or changing lesions.  History of Dysplastic Nevi Right lateral deltoid 09/03/2018 -  No evidence of recurrence today - Recommend regular full body skin exams - Recommend daily broad spectrum sunscreen SPF 30+ to sun-exposed areas, reapply every 2 hours as needed.  - Call if any new or changing lesions are noted between office visits   Skin cancer screening performed today.    Return in about 1 year (around 02/15/2023) for TBSE, hx of Dysplastic nevus .  IMarye Round, CMA, am acting as scribe for Sarina Ser, MD .  Documentation: I have reviewed the above documentation for accuracy and completeness, and I agree with the above.  Sarina Ser, MD

## 2022-02-25 ENCOUNTER — Encounter: Payer: Self-pay | Admitting: Dermatology

## 2023-02-20 ENCOUNTER — Ambulatory Visit (INDEPENDENT_AMBULATORY_CARE_PROVIDER_SITE_OTHER): Payer: Medicare Other | Admitting: Dermatology

## 2023-02-20 DIAGNOSIS — L219 Seborrheic dermatitis, unspecified: Secondary | ICD-10-CM

## 2023-02-20 DIAGNOSIS — W908XXA Exposure to other nonionizing radiation, initial encounter: Secondary | ICD-10-CM

## 2023-02-20 DIAGNOSIS — Z79899 Other long term (current) drug therapy: Secondary | ICD-10-CM

## 2023-02-20 DIAGNOSIS — D1801 Hemangioma of skin and subcutaneous tissue: Secondary | ICD-10-CM

## 2023-02-20 DIAGNOSIS — L578 Other skin changes due to chronic exposure to nonionizing radiation: Secondary | ICD-10-CM | POA: Diagnosis not present

## 2023-02-20 DIAGNOSIS — Z1283 Encounter for screening for malignant neoplasm of skin: Secondary | ICD-10-CM

## 2023-02-20 DIAGNOSIS — D229 Melanocytic nevi, unspecified: Secondary | ICD-10-CM

## 2023-02-20 DIAGNOSIS — L814 Other melanin hyperpigmentation: Secondary | ICD-10-CM

## 2023-02-20 DIAGNOSIS — L821 Other seborrheic keratosis: Secondary | ICD-10-CM

## 2023-02-20 DIAGNOSIS — L719 Rosacea, unspecified: Secondary | ICD-10-CM

## 2023-02-20 DIAGNOSIS — Z7189 Other specified counseling: Secondary | ICD-10-CM

## 2023-02-20 DIAGNOSIS — Z86018 Personal history of other benign neoplasm: Secondary | ICD-10-CM

## 2023-02-20 MED ORDER — KETOCONAZOLE 2 % EX SHAM: MEDICATED_SHAMPOO | CUTANEOUS | 11 refills | Status: DC

## 2023-02-20 MED ORDER — METRONIDAZOLE 0.75 % EX GEL: CUTANEOUS | 11 refills | Status: DC

## 2023-02-20 MED ORDER — KETOCONAZOLE 2 % EX CREA: TOPICAL_CREAM | CUTANEOUS | 6 refills | Status: DC

## 2023-02-20 NOTE — Progress Notes (Signed)
Follow-Up Visit   Subjective  Timothy Farley is a 47 y.o. male who presents for the following: Skin Cancer Screening and Full Body Skin Exam  The patient presents for Total-Body Skin Exam (TBSE) for skin cancer screening and mole check. The patient has spots, moles and lesions to be evaluated, some may be new or changing and the patient may have concern these could be cancer.  The following portions of the chart were reviewed this encounter and updated as appropriate: medications, allergies, medical history  Review of Systems:  No other skin or systemic complaints except as noted in HPI or Assessment and Plan.  Objective  Well appearing patient in no apparent distress; mood and affect are within normal limits.  A full examination was performed including scalp, head, eyes, ears, nose, lips, neck, chest, axillae, abdomen, back, buttocks, bilateral upper extremities, bilateral lower extremities, hands, feet, fingers, toes, fingernails, and toenails. All findings within normal limits unless otherwise noted below.   Relevant physical exam findings are noted in the Assessment and Plan.     Assessment & Plan   SKIN CANCER SCREENING PERFORMED TODAY.  ACTINIC DAMAGE - Chronic condition, secondary to cumulative UV/sun exposure - diffuse scaly erythematous macules with underlying dyspigmentation - Recommend daily broad spectrum sunscreen SPF 30+ to sun-exposed areas, reapply every 2 hours as needed.  - Staying in the shade or wearing long sleeves, sun glasses (UVA+UVB protection) and wide brim hats (4-inch brim around the entire circumference of the hat) are also recommended for sun protection.  - Call for new or changing lesions.  LENTIGINES, SEBORRHEIC KERATOSES, HEMANGIOMAS - Benign normal skin lesions - Benign-appearing - Call for any changes - Counseling for BBL / IPL / Laser and Coordination of Care Discussed the treatment option of Broad Band Light (BBL) /Intense Pulsed Light  (IPL)/ Laser for skin discoloration, including brown spots and redness.  Typically we recommend at least 1-3 treatment sessions about 5-8 weeks apart for best results.  Cannot have tanned skin when BBL performed, and regular use of sunscreen/photoprotection is advised after the procedure to help maintain results. The patient's condition may also require "maintenance treatments" in the future.  The fee for BBL / laser treatments is $350 per treatment session for the whole face.  A fee can be quoted for other parts of the body.  Insurance typically does not pay for BBL/laser treatments and therefore the fee is an out-of-pocket cost. Recommend prophylactic valtrex treatment. Once scheduled for procedure, will send Rx in prior to patient's appointment.   MELANOCYTIC NEVI - Tan-brown and/or pink-flesh-colored symmetric macules and papules - Benign appearing on exam today - Observation - Call clinic for new or changing moles - Recommend daily use of broad spectrum spf 30+ sunscreen to sun-exposed areas.   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  Call if any new or changing lesions are noted between office visits  SEBORRHEIC DERMATITIS Exam: Pink patches with greasy scale at the face, trunk, ears, and scalp  Chronic condition with duration or expected duration over one year. Currently well-controlled.  Seborrheic Dermatitis is a chronic persistent rash characterized by pinkness and scaling most commonly of the mid face but also can occur on the scalp (dandruff), ears; mid chest, mid back and groin.  It tends to be exacerbated by stress and cooler weather.  People who have neurologic disease  may experience new onset or exacerbation of existing seborrheic dermatitis.  The condition is not curable but treatable and can be controlled.  Treatment  Plan:  Continue Ketoconazole 2% shampoo to the scalp, and trunk 2-3 d/wk. Let sit 5-10 minutes before washing off.  Continue Ketoconazole 2% cream M, W, F  SEBORRHEIC DERMATITIS   Related Medications ketoconazole (NIZORAL) 2 % shampoo Shampoo into the scalp and on the body let sit 5-10 minutes then wash off. Use 3 times per week. ketoconazole (NIZORAL) 2 % cream Apply to face once a day Mon, Wed, Fri ROSACEA Exam Mid face erythema with telangiectasias +/- scattered inflammatory papules on the nose.  Chronic and persistent condition with duration or expected duration over one year. Condition is symptomatic/ bothersome to patient. Not currently at goal.  Rosacea is a chronic progressive skin condition usually affecting the face of adults, causing redness and/or acne bumps. It is treatable but not curable. It sometimes affects the eyes (ocular rosacea) as well. It may respond to topical and/or systemic medication and can flare with stress, sun exposure, alcohol, exercise, topical steroids (including hydrocortisone/cortisone 10) and some foods.  Daily application of broad spectrum spf 30+ sunscreen to face is recommended to reduce flares.  Treatment Plan Start Metrogel on the nose at bedtime.    Return in about 1 year (around 02/20/2024) for TBSE.  Maylene Roes, CMA, am acting as scribe for Armida Sans, MD .   Documentation: I have reviewed the above documentation for accuracy and completeness, and I agree with the above.  Armida Sans, MD

## 2023-02-20 NOTE — Patient Instructions (Signed)

## 2023-02-24 ENCOUNTER — Encounter: Payer: Self-pay | Admitting: Dermatology

## 2024-02-20 ENCOUNTER — Ambulatory Visit: Payer: Medicare Other | Admitting: Dermatology

## 2024-02-20 ENCOUNTER — Ambulatory Visit: Admitting: Dermatology

## 2024-02-20 ENCOUNTER — Encounter: Payer: Self-pay | Admitting: Dermatology

## 2024-02-20 DIAGNOSIS — L918 Other hypertrophic disorders of the skin: Secondary | ICD-10-CM

## 2024-02-20 DIAGNOSIS — L719 Rosacea, unspecified: Secondary | ICD-10-CM | POA: Diagnosis not present

## 2024-02-20 DIAGNOSIS — D229 Melanocytic nevi, unspecified: Secondary | ICD-10-CM

## 2024-02-20 DIAGNOSIS — W908XXA Exposure to other nonionizing radiation, initial encounter: Secondary | ICD-10-CM | POA: Diagnosis not present

## 2024-02-20 DIAGNOSIS — D485 Neoplasm of uncertain behavior of skin: Secondary | ICD-10-CM | POA: Diagnosis not present

## 2024-02-20 DIAGNOSIS — L578 Other skin changes due to chronic exposure to nonionizing radiation: Secondary | ICD-10-CM

## 2024-02-20 DIAGNOSIS — L219 Seborrheic dermatitis, unspecified: Secondary | ICD-10-CM | POA: Diagnosis not present

## 2024-02-20 DIAGNOSIS — Z79899 Other long term (current) drug therapy: Secondary | ICD-10-CM

## 2024-02-20 DIAGNOSIS — Z1283 Encounter for screening for malignant neoplasm of skin: Secondary | ICD-10-CM

## 2024-02-20 DIAGNOSIS — L821 Other seborrheic keratosis: Secondary | ICD-10-CM | POA: Diagnosis not present

## 2024-02-20 DIAGNOSIS — L814 Other melanin hyperpigmentation: Secondary | ICD-10-CM

## 2024-02-20 DIAGNOSIS — Z86018 Personal history of other benign neoplasm: Secondary | ICD-10-CM

## 2024-02-20 DIAGNOSIS — Z7189 Other specified counseling: Secondary | ICD-10-CM

## 2024-02-20 MED ORDER — KETOCONAZOLE 2 % EX SHAM: MEDICATED_SHAMPOO | CUTANEOUS | 11 refills | Status: AC

## 2024-02-20 MED ORDER — METRONIDAZOLE 0.75 % EX GEL: CUTANEOUS | 11 refills | Status: AC

## 2024-02-20 MED ORDER — KETOCONAZOLE 2 % EX CREA: TOPICAL_CREAM | CUTANEOUS | 11 refills | Status: AC

## 2024-02-20 NOTE — Patient Instructions (Addendum)

## 2024-02-20 NOTE — Progress Notes (Signed)
 Follow-Up Visit   Subjective  Timothy Farley is a 48 y.o. male who presents for the following: Skin Cancer Screening and Full Body Skin Exam  The patient presents for Total-Body Skin Exam (TBSE) for skin cancer screening and mole check. The patient has spots, moles and lesions to be evaluated, some may be new or changing and the patient may have concern these could be cancer.  The following portions of the chart were reviewed this encounter and updated as appropriate: medications, allergies, medical history  Review of Systems:  No other skin or systemic complaints except as noted in HPI or Assessment and Plan.  Objective  Well appearing patient in no apparent distress; mood and affect are within normal limits.  A full examination was performed including scalp, head, eyes, ears, nose, lips, neck, chest, axillae, abdomen, back, buttocks, bilateral upper extremities, bilateral lower extremities, hands, feet, fingers, toes, fingernails, and toenails. All findings within normal limits unless otherwise noted below.   Relevant physical exam findings are noted in the Assessment and Plan. R low back above sacral area 0.7 x 0.5 cm flesh colored papule.  Assessment & Plan   SKIN CANCER SCREENING PERFORMED TODAY.  ACTINIC DAMAGE - Chronic condition, secondary to cumulative UV/sun exposure - diffuse scaly erythematous macules with underlying dyspigmentation - Recommend daily broad spectrum sunscreen SPF 30+ to sun-exposed areas, reapply every 2 hours as needed.  - Staying in the shade or wearing long sleeves, sun glasses (UVA+UVB protection) and wide brim hats (4-inch brim around the entire circumference of the hat) are also recommended for sun protection.  - Call for new or changing lesions.  LENTIGINES, SEBORRHEIC KERATOSES, HEMANGIOMAS - Benign normal skin lesions - Benign-appearing - Call for any changes  MELANOCYTIC NEVI - Tan-brown and/or pink-flesh-colored symmetric macules and  papules - Benign appearing on exam today - Observation - Call clinic for new or changing moles - Recommend daily use of broad spectrum spf 30+ sunscreen to sun-exposed areas.   HISTORY OF DYSPLASTIC NEVUS - R lat deltoid, moderate, 09/03/2018 No evidence of recurrence today Recommend regular full body skin exams Recommend daily broad spectrum sunscreen SPF 30+ to sun-exposed areas, reapply every 2 hours as needed.  Call if any new or changing lesions are noted between office visits   SEBORRHEIC DERMATITIS Exam: Pink patches with greasy scale at the face, trunk, ears, and scalp Chronic condition with duration or expected duration over one year. Currently well-controlled. Seborrheic Dermatitis is a chronic persistent rash characterized by pinkness and scaling most commonly of the mid face but also can occur on the scalp (dandruff), ears; mid chest, mid back and groin.  It tends to be exacerbated by stress and cooler weather.  People who have neurologic disease may experience new onset or exacerbation of existing seborrheic dermatitis.  The condition is not curable but treatable and can be controlled. Treatment Plan: Continue Ketoconazole  2% shampoo to the scalp and face. Let sit 5-10 minutes before washing off.  Continue Ketoconazole  2% cream M, W, F  ROSACEA Exam Mid face erythema with telangiectasias +/- scattered inflammatory papules on the nose. Chronic and persistent condition with duration or expected duration over one year. Condition is symptomatic/ bothersome to patient. Not currently at goal. Rosacea is a chronic progressive skin condition usually affecting the face of adults, causing redness and/or acne bumps. It is treatable but not curable. It sometimes affects the eyes (ocular rosacea) as well. It may respond to topical and/or systemic medication and can flare with stress,  sun exposure, alcohol, exercise, topical steroids (including hydrocortisone /cortisone 10) and some foods.   Daily application of broad spectrum spf 30+ sunscreen to face is recommended to reduce flares. Treatment Plan Continue Metrogel  on the nose at bedtime.  NEOPLASM OF UNCERTAIN BEHAVIOR OF SKIN R low back above sacral area - Epidermal / dermal shaving  Lesion diameter (cm):  0.7 Informed consent: discussed and consent obtained   Timeout: patient name, date of birth, surgical site, and procedure verified   Procedure prep:  Patient was prepped and draped in usual sterile fashion Prep type:  Isopropyl alcohol Anesthesia: the lesion was anesthetized in a standard fashion   Anesthetic:  1% lidocaine  w/ epinephrine 1-100,000 buffered w/ 8.4% NaHCO3 Instrument used: flexible razor blade   Hemostasis achieved with: pressure, aluminum chloride and electrodesiccation   Outcome: patient tolerated procedure well   Post-procedure details: sterile dressing applied and wound care instructions given   Dressing type: bandage (Mupirocin 2% ointment)    Specimen 1 - Surgical pathology Differential Diagnosis: D48.5 irritated nevus vs skin tag Check Margins: Yes  Return in about 1 year (around 02/19/2025) for TBSE - hx dysplastic nevus, rosacea, seb derm.  LILLETTE Rosina Mayans, CMA, am acting as scribe for Alm Rhyme, MD .   Documentation: I have reviewed the above documentation for accuracy and completeness, and I agree with the above.  Alm Rhyme, MD

## 2024-02-24 ENCOUNTER — Ambulatory Visit: Payer: Self-pay | Admitting: Dermatology

## 2024-02-24 LAB — SURGICAL PATHOLOGY

## 2024-02-25 NOTE — Telephone Encounter (Addendum)
 Called and discussed results with patient's mother who his caregiver. ----- Message from Alm Rhyme, MD sent at 02/24/2024  6:08 PM EST ----- FINAL DIAGNOSIS        1. Skin, R low back above sacral area :       FIBROEPITHELIAL POLYP    Benign skin tag No further treatment needed

## 2025-03-24 ENCOUNTER — Ambulatory Visit: Admitting: Dermatology
